# Patient Record
Sex: Male | Born: 1955 | Race: White | Hispanic: No | Marital: Single | Smoking: Former smoker
Health system: Southern US, Community
[De-identification: ages and names within clinical notes are randomized; demographics above are authoritative.]

## PROBLEM LIST (undated history)

## (undated) DIAGNOSIS — M1990 Unspecified osteoarthritis, unspecified site: Secondary | ICD-10-CM

## (undated) DIAGNOSIS — J189 Pneumonia, unspecified organism: Secondary | ICD-10-CM

## (undated) DIAGNOSIS — Z889 Allergy status to unspecified drugs, medicaments and biological substances status: Secondary | ICD-10-CM

## (undated) DIAGNOSIS — E7800 Pure hypercholesterolemia, unspecified: Secondary | ICD-10-CM

## (undated) DIAGNOSIS — I10 Essential (primary) hypertension: Secondary | ICD-10-CM

## (undated) DIAGNOSIS — J449 Chronic obstructive pulmonary disease, unspecified: Secondary | ICD-10-CM

## (undated) DIAGNOSIS — K219 Gastro-esophageal reflux disease without esophagitis: Secondary | ICD-10-CM

## (undated) DIAGNOSIS — R7303 Prediabetes: Secondary | ICD-10-CM

## (undated) DIAGNOSIS — Z87442 Personal history of urinary calculi: Secondary | ICD-10-CM

## (undated) HISTORY — PX: COLONOSCOPY: SHX174

## (undated) HISTORY — PX: UPPER GI ENDOSCOPY: SHX6162

---

## 2010-07-18 ENCOUNTER — Inpatient Hospital Stay (HOSPITAL_COMMUNITY): Admission: EM | Admit: 2010-07-18 | Discharge: 2010-07-20 | Payer: Self-pay | Admitting: Emergency Medicine

## 2010-08-17 ENCOUNTER — Emergency Department (HOSPITAL_COMMUNITY): Admission: EM | Admit: 2010-08-17 | Discharge: 2010-08-17 | Payer: Self-pay | Admitting: Emergency Medicine

## 2011-03-13 LAB — POCT I-STAT, CHEM 8
BUN: 14 mg/dL (ref 6–23)
Calcium, Ion: 1.15 mmol/L (ref 1.12–1.32)
Chloride: 104 mEq/L (ref 96–112)
Creatinine, Ser: 1.2 mg/dL (ref 0.4–1.5)
Glucose, Bld: 112 mg/dL — ABNORMAL HIGH (ref 70–99)
HCT: 48 % (ref 39.0–52.0)
Potassium: 4.1 mEq/L (ref 3.5–5.1)

## 2011-03-13 LAB — URINALYSIS, ROUTINE W REFLEX MICROSCOPIC
Bilirubin Urine: NEGATIVE
Glucose, UA: NEGATIVE mg/dL
Hgb urine dipstick: NEGATIVE
Nitrite: NEGATIVE
Specific Gravity, Urine: 1.019 (ref 1.005–1.030)

## 2011-03-13 LAB — RAPID URINE DRUG SCREEN, HOSP PERFORMED
Barbiturates: NOT DETECTED
Benzodiazepines: POSITIVE — AB
Opiates: NOT DETECTED

## 2011-03-14 LAB — BASIC METABOLIC PANEL
BUN: 12 mg/dL (ref 6–23)
BUN: 17 mg/dL (ref 6–23)
CO2: 28 mEq/L (ref 19–32)
CO2: 29 mEq/L (ref 19–32)
Calcium: 9.2 mg/dL (ref 8.4–10.5)
Calcium: 9.6 mg/dL (ref 8.4–10.5)
Creatinine, Ser: 1.13 mg/dL (ref 0.4–1.5)
GFR calc non Af Amer: >60 mL/min (ref >60)
Glucose, Bld: 105 mg/dL — ABNORMAL HIGH (ref 70–99)
Potassium: 4.1 mEq/L (ref 3.5–5.1)
Potassium: 4.5 mEq/L (ref 3.5–5.1)

## 2011-03-14 LAB — CARDIAC PANEL(CRET KIN+CKTOT+MB+TROPI)
Relative Index: INVALID (ref 0.0–2.5)
Total CK: 64 U/L (ref 7–232)
Troponin I: 0.02 ng/mL (ref 0.00–0.06)

## 2011-03-14 LAB — DIFFERENTIAL
Basophils Relative: 0 % (ref 0–1)
Eosinophils Relative: 0 % (ref 0–5)
Lymphocytes Relative: 16 % (ref 12–46)
Lymphs Abs: 1.2 10*3/uL (ref 0.7–4.0)
Monocytes Absolute: 0.7 10*3/uL (ref 0.1–1.0)

## 2011-03-14 LAB — TROPONIN I: Troponin I: 0.01 ng/mL (ref 0.00–0.06)

## 2011-03-14 LAB — POCT CARDIAC MARKERS
CKMB, poc: <1 ng/mL — ABNORMAL LOW (ref 1.0–8.0)
Myoglobin, poc: 76.7 ng/mL (ref 12–200)
Troponin i, poc: <0.05 ng/mL (ref 0.00–0.09)

## 2011-03-14 LAB — CBC
HCT: 44.6 % (ref 39.0–52.0)
Hemoglobin: 14.5 g/dL (ref 13.0–17.0)
Hemoglobin: 15.5 g/dL (ref 13.0–17.0)
MCH: 32.3 pg (ref 26.0–34.0)
MCHC: 34.1 g/dL (ref 30.0–36.0)
MCV: 92.8 fL (ref 78.0–100.0)
MCV: 94.3 fL (ref 78.0–100.0)
RBC: 4.8 MIL/uL (ref 4.22–5.81)

## 2011-03-14 LAB — GLUCOSE, CAPILLARY
Glucose-Capillary: 107 mg/dL — ABNORMAL HIGH (ref 70–99)
Glucose-Capillary: 112 mg/dL — ABNORMAL HIGH (ref 70–99)
Glucose-Capillary: 123 mg/dL — ABNORMAL HIGH (ref 70–99)

## 2011-03-14 LAB — LIPID PANEL
Cholesterol: 233 mg/dL — ABNORMAL HIGH (ref 0–200)
Total CHOL/HDL Ratio: 5.1 RATIO
Triglycerides: 183 mg/dL — ABNORMAL HIGH (ref <150)
VLDL: 37 mg/dL (ref 0–40)

## 2011-03-14 LAB — HEMOGLOBIN A1C: Mean Plasma Glucose: 117 mg/dL — ABNORMAL HIGH (ref <117)

## 2011-03-14 LAB — CK TOTAL AND CKMB (NOT AT ARMC)
CK, MB: 1 ng/mL (ref 0.3–4.0)
Total CK: 56 U/L (ref 7–232)

## 2011-07-30 IMAGING — CR DG CHEST 1V PORT
1 series · 1 of 1 positions shown · non-contrast
Comparison: None.

CLINICAL DATA: Chest pain and short of breath.

PORTABLE CHEST - 1 VIEW

[view not recorded]
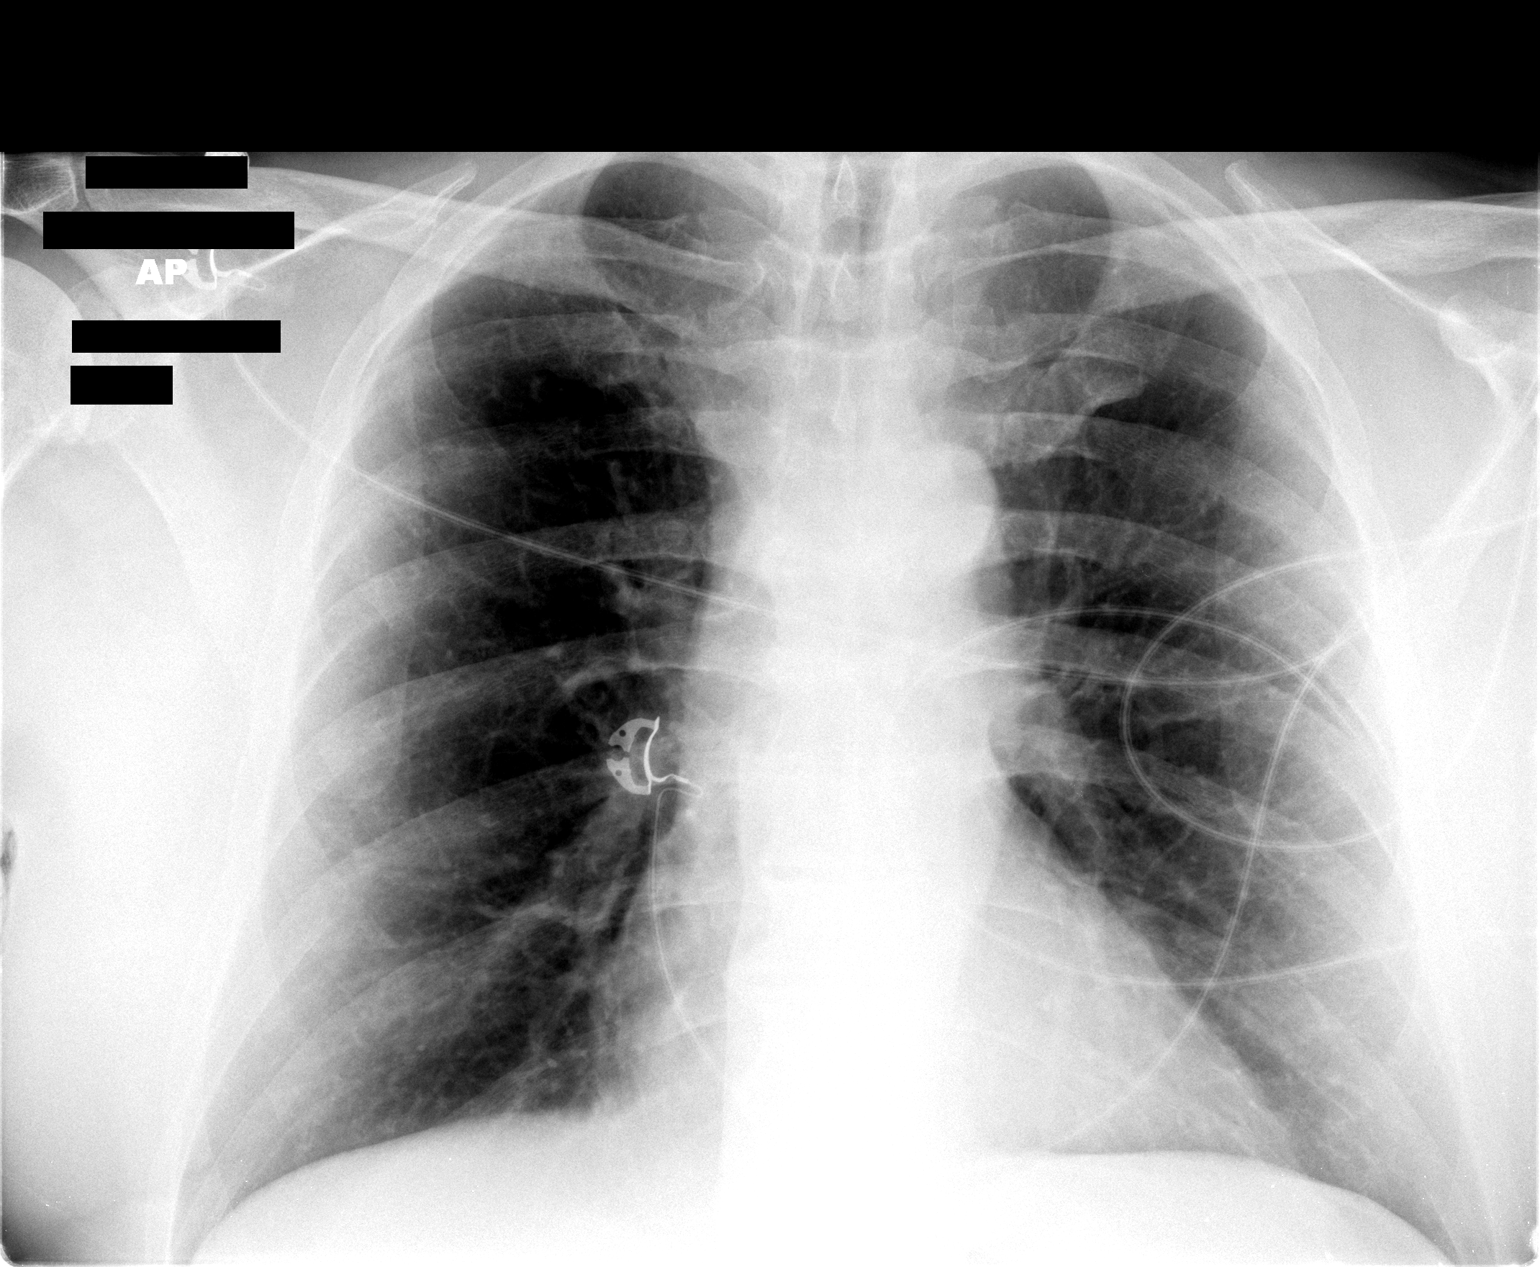

[1 of 1 positions shown; findings below may reference images not displayed]

FINDINGS: 5787 hours.  The lungs are clear without focal
infiltrate, edema, pneumothorax or pleural effusion.
Cardiopericardial silhouette is at upper limits of normal for size.
Telemetry leads overlie the chest.
IMPRESSION: No acute findings.

## 2012-03-08 ENCOUNTER — Ambulatory Visit (INDEPENDENT_AMBULATORY_CARE_PROVIDER_SITE_OTHER): Payer: No Typology Code available for payment source | Admitting: General Surgery

## 2012-03-12 ENCOUNTER — Emergency Department (HOSPITAL_COMMUNITY): Payer: No Typology Code available for payment source

## 2012-03-12 ENCOUNTER — Other Ambulatory Visit: Payer: Self-pay

## 2012-03-12 ENCOUNTER — Encounter (HOSPITAL_COMMUNITY): Payer: Self-pay

## 2012-03-12 ENCOUNTER — Emergency Department (HOSPITAL_COMMUNITY)
Admission: EM | Admit: 2012-03-12 | Discharge: 2012-03-12 | Disposition: A | Discharge status: Home / Self Care | Payer: No Typology Code available for payment source | Attending: Emergency Medicine | Admitting: Emergency Medicine

## 2012-03-12 DIAGNOSIS — R0602 Shortness of breath: Secondary | ICD-10-CM | POA: Insufficient documentation

## 2012-03-12 DIAGNOSIS — J189 Pneumonia, unspecified organism: Secondary | ICD-10-CM | POA: Insufficient documentation

## 2012-03-12 DIAGNOSIS — Z79899 Other long term (current) drug therapy: Secondary | ICD-10-CM | POA: Insufficient documentation

## 2012-03-12 DIAGNOSIS — E119 Type 2 diabetes mellitus without complications: Secondary | ICD-10-CM | POA: Insufficient documentation

## 2012-03-12 DIAGNOSIS — I10 Essential (primary) hypertension: Secondary | ICD-10-CM | POA: Insufficient documentation

## 2012-03-12 HISTORY — DX: Essential (primary) hypertension: I10

## 2012-03-12 LAB — BASIC METABOLIC PANEL
BUN: 15 mg/dL (ref 6–23)
CO2: 26 mEq/L (ref 19–32)
Calcium: 9.8 mg/dL (ref 8.4–10.5)
GFR calc non Af Amer: >90 mL/min (ref >90)
Glucose, Bld: 108 mg/dL — ABNORMAL HIGH (ref 70–99)

## 2012-03-12 LAB — CBC
HCT: 46.4 % (ref 39.0–52.0)
Hemoglobin: 17 g/dL (ref 13.0–17.0)
MCH: 32 pg (ref 26.0–34.0)
MCV: 87.2 fL (ref 78.0–100.0)
RBC: 5.32 MIL/uL (ref 4.22–5.81)
WBC: 6 10*3/uL (ref 4.0–10.5)

## 2012-03-12 LAB — DIFFERENTIAL
Eosinophils Absolute: 0 10*3/uL (ref 0.0–0.7)
Eosinophils Relative: 1 % (ref 0–5)
Lymphocytes Relative: 27 % (ref 12–46)
Lymphs Abs: 1.6 10*3/uL (ref 0.7–4.0)
Monocytes Relative: 18 % — ABNORMAL HIGH (ref 3–12)

## 2012-03-12 MED ORDER — MOXIFLOXACIN HCL IN NACL 400 MG/250ML IV SOLN
400.0000 mg | Freq: Once | INTRAVENOUS | Status: AC
  Administered 2012-03-12: 400 mg via INTRAVENOUS
  Filled 2012-03-12: qty 250

## 2012-03-12 MED ORDER — ALBUTEROL SULFATE HFA 108 (90 BASE) MCG/ACT IN AERS: 1.0000 | INHALATION_SPRAY | Freq: Four times a day (QID) | RESPIRATORY_TRACT | Status: AC | PRN

## 2012-03-12 MED ORDER — MOXIFLOXACIN HCL 400 MG PO TABS: 400.0000 mg | ORAL_TABLET | Freq: Every day | ORAL | Status: AC

## 2012-03-12 MED ORDER — ALBUTEROL SULFATE (5 MG/ML) 0.5% IN NEBU
5.0000 mg | INHALATION_SOLUTION | Freq: Once | RESPIRATORY_TRACT | Status: AC
  Administered 2012-03-12: 5 mg via RESPIRATORY_TRACT
  Filled 2012-03-12: qty 1

## 2012-03-12 MED ORDER — SODIUM CHLORIDE 0.9 % IV BOLUS (SEPSIS)
1000.0000 mL | Freq: Once | INTRAVENOUS | Status: AC
  Administered 2012-03-12: 1000 mL via INTRAVENOUS

## 2012-03-12 NOTE — ED Provider Notes (Signed)
History     CSN: [id number]  Arrival date & time 03/12/12  6213   First MD Initiated Contact with Patient 03/12/12 1005      Chief Complaint  Patient presents with  . Shortness of Breath    (Consider location/radiation/quality/duration/timing/severity/associated sxs/prior treatment) HPI  Patient presents to ER complaining of a 1-2 week hx of gradual onset sinus congestion, cough, SOB and chest congestion that he states has been persistent and worsening over time. Patient states he went to see PCP Dr. Donovan Kail last week and given cough medication but states that symptoms have been ongoing and worsening despite cough medication. Patient states he generally has felt fatigued and has called out of week all last week because "no energy to go in." Patient states he has felt hot and cold but has no known recorded temperature. Denies HA, sore throat, CP, abdominal pain, n/v/d, lower extremity pain or swelling. Patient has remote hx of tobacco abuse stating he quit smoking almost 9 years ago.   Past Medical History  Diagnosis Date  . Hypertension   . Diabetes mellitus     History reviewed. No pertinent past surgical history.  History reviewed. No pertinent family history.  History  Substance Use Topics  . Smoking status: Never Smoker   . Smokeless tobacco: Not on file  . Alcohol Use: Yes     history of.      Review of Systems  All other systems reviewed and are negative.    Allergies  Xanax  Home Medications   Current Outpatient Rx  Name Route Sig Dispense Refill  . VITAMIN C 1000 MG PO TABS Oral Take 2,000 mg by mouth daily.    . ASPIRIN EC 325 MG PO TBEC Oral Take 325 mg by mouth daily as needed. Pain    . BENZONATATE 200 MG PO CAPS Oral Take 200 mg by mouth 3 (three) times daily as needed. For cough    . CRANBERRY EXTRACT PO Oral Take 1 tablet by mouth daily.    . CYCLOBENZAPRINE HCL 10 MG PO TABS Oral Take 10 mg by mouth 3 (three) times daily.    Marland Kitchen  HYDROCODONE-HOMATROPINE 5-1.5 MG/5ML PO SYRP Oral Take 5 mLs by mouth every 6 (six) hours as needed. For cough    . LISINOPRIL-HYDROCHLOROTHIAZIDE 20-25 MG PO TABS Oral Take 1 tablet by mouth daily.    . CENTRUM SILVER PO Oral Take 1 tablet by mouth daily.    Marland Kitchen NAPROXEN 375 MG PO TABS Oral Take 375 mg by mouth 2 (two) times daily with a meal.    . SERTRALINE HCL 50 MG PO TABS Oral Take 25 mg by mouth daily.      BP 150/91  Pulse 120  Temp(Src) 98 F (36.7 C) (Oral)  Resp 24  SpO2 98%  Physical Exam  Nursing note and vitals reviewed. Constitutional: He is oriented to person, place, and time. He appears well-developed and well-nourished. No distress.  HENT:  Head: Normocephalic and atraumatic.  Eyes: Conjunctivae are normal.  Neck: Normal range of motion. Neck supple.  Cardiovascular: Regular rhythm, normal heart sounds and intact distal pulses.  Tachycardia present.  Exam reveals no gallop and no friction rub.   No murmur heard. Pulmonary/Chest: Effort normal. No respiratory distress. He has no wheezes. He has rhonchi in the right upper field, the right middle field, the right lower field, the left upper field, the left middle field and the left lower field. He has rales in the right lower  field and the left lower field. He exhibits no tenderness.  Abdominal: Bowel sounds are normal. He exhibits no distension and no mass. There is no tenderness. There is no rebound and no guarding.  Musculoskeletal: Normal range of motion. He exhibits no edema and no tenderness.  Neurological: He is alert and oriented to person, place, and time.  Skin: Skin is warm and dry. No rash noted. He is not diaphoretic. No erythema.  Psychiatric: He has a normal mood and affect.    ED Course  Procedures (including critical care time)  IV fluids, neb albuterol/atrovent  Labs Reviewed  CBC - Abnormal; Notable for the following:    MCHC 36.6 (*)    All other components within normal limits  DIFFERENTIAL -  Abnormal; Notable for the following:    Monocytes Relative 18 (*)    Monocytes Absolute 1.1 (*)    All other components within normal limits  BASIC METABOLIC PANEL - Abnormal; Notable for the following:    Sodium 132 (*)    Chloride 95 (*)    Glucose, Bld 108 (*)    All other components within normal limits   Dg Chest 2 View  03/12/2012  *RADIOLOGY REPORT*  Clinical Data: Cough, short of breath  CHEST - 2 VIEW  Comparison: Chest radiograph 07/18/2010  Findings: Normal cardiac silhouette.  The there is a left retrocardiac airspace opacity.  There is an 10 mm angular nodular density at the left lung base.  Right lung is clear. Degenerative osteophytosis of the thoracic spine.  IMPRESSION:  1.  Concern for left lower lobe pneumonia. 2.  Nodular density in the left lower lobe may relate to air space disease.  Recommend follow-up radiographs to ensure resolution and exclude pulmonary nodule.  Original Report Authenticated By: Genevive Bi, M.D.     1. CAP (community acquired pneumonia)      Date: 03/12/2012  Rate: 99  Rhythm: normal sinus rhythm  QRS Axis: normal  Intervals: normal  ST/T Wave abnormalities: normal  Conduction Disutrbances: none  Narrative Interpretation:   Old EKG Reviewed: No significant changes noted     MDM  Patient states he's feeling much improved with albuterol treatment an IV fluids. He has low comorbidities and therefore is appropriate for outpatient management of community-acquired pneumonia and he has an established primary care physician that he is agreeable to close followup next week for recheck. His heart rate is improved with IV fluids. He denies any associated chest pain. States his shortness of breath is dramatically improved after treatment. Patient is agreeable to returning to ER for any changing or worsening symptoms. He is afebrile and nontoxic-appearing. Pulse ox is 98% on room air. No respiratory distress the        [place],  [place] 03/12/12 1141  Lenon Oms [place], [place] 03/12/12 1219

## 2012-03-12 NOTE — Discharge Instructions (Signed)
Take antibiotic in its complete course. Use albuterol inhaler as needed for cough and shortness of breath. Stay well-hydrated. Followup with Dr. Tenny Craw next week for recheck but return to ER for changing or worsening of symptoms.  Pneumonia, Adult Pneumonia is an infection of the lungs.  CAUSES Pneumonia may be caused by bacteria or a virus. Usually, these infections are caused by breathing infectious particles into the lungs (respiratory tract). SYMPTOMS   Cough.   Fever.   Chest pain.   Increased rate of breathing.   Wheezing.   Mucus production.  DIAGNOSIS  If you have the common symptoms of pneumonia, your caregiver will typically confirm the diagnosis with a chest X-ray. The X-ray will show an abnormality in the lung (pulmonary infiltrate) if you have pneumonia. Other tests of your blood, urine, or sputum may be done to find the specific cause of your pneumonia. Your caregiver may also do tests (blood gases or pulse oximetry) to see how well your lungs are working. TREATMENT  Some forms of pneumonia may be spread to other people when you cough or sneeze. You may be asked to wear a mask before and during your exam. Pneumonia that is caused by bacteria is treated with antibiotic medicine. Pneumonia that is caused by the influenza virus may be treated with an antiviral medicine. Most other viral infections must run their course. These infections will not respond to antibiotics.  PREVENTION A pneumococcal shot (vaccine) is available to prevent a common bacterial cause of pneumonia. This is usually suggested for:  People over 49 years old.   Patients on chemotherapy.   People with chronic lung problems, such as bronchitis or emphysema.   People with immune system problems.  If you are over 65 or have a high risk condition, you may receive the pneumococcal vaccine if you have not received it before. In some countries, a routine influenza vaccine is also recommended. This vaccine can  help prevent some cases of pneumonia.You may be offered the influenza vaccine as part of your care. If you smoke, it is time to quit. You may receive instructions on how to stop smoking. Your caregiver can provide medicines and counseling to help you quit. HOME CARE INSTRUCTIONS   Cough suppressants may be used if you are losing too much rest. However, coughing protects you by clearing your lungs. You should avoid using cough suppressants if you can.   Your caregiver may have prescribed medicine if he or she thinks your pneumonia is caused by a bacteria or influenza. Finish your medicine even if you start to feel better.   Your caregiver may also prescribe an expectorant. This loosens the mucus to be coughed up.   Only take over-the-counter or prescription medicines for pain, discomfort, or fever as directed by your caregiver.   Do not smoke. Smoking is a common cause of bronchitis and can contribute to pneumonia. If you are a smoker and continue to smoke, your cough may last several weeks after your pneumonia has cleared.   A cold steam vaporizer or humidifier in your room or home may help loosen mucus.   Coughing is often worse at night. Sleeping in a semi-upright position in a recliner or using a couple pillows under your head will help with this.   Get rest as you feel it is needed. Your body will usually let you know when you need to rest.  SEEK IMMEDIATE MEDICAL CARE IF:   Your illness becomes worse. This is especially true if you  are elderly or weakened from any other disease.   You cannot control your cough with suppressants and are losing sleep.   You begin coughing up blood.   You develop pain which is getting worse or is uncontrolled with medicines.   You have a fever.   Any of the symptoms which initially brought you in for treatment are getting worse rather than better.   You develop shortness of breath or chest pain.  MAKE SURE YOU:   Understand these instructions.     Will watch your condition.   Will get help right away if you are not doing well or get worse.  Document Released: 12/14/2005 Document Revised: 12/03/2011 Document Reviewed: 03/05/2011 [facility] Patient Information 2012 [place], [place].

## 2012-03-12 NOTE — ED Notes (Signed)
Paged rt for neb

## 2012-03-12 NOTE — ED Notes (Signed)
Coughing and sob, onset last week, sts seen md for same and given medicine for cough, sts he now feels like it may be pna.

## 2012-03-13 NOTE — ED Provider Notes (Signed)
Medical screening examination/treatment/procedure(s) were performed by non-physician practitioner and as supervising physician I was immediately available for consultation/collaboration.  Noor Witte R. Dacey Milberger, MD 03/13/12 0655 

## 2012-04-05 ENCOUNTER — Ambulatory Visit (INDEPENDENT_AMBULATORY_CARE_PROVIDER_SITE_OTHER): Payer: No Typology Code available for payment source | Admitting: General Surgery

## 2015-05-15 ENCOUNTER — Emergency Department (HOSPITAL_COMMUNITY): Payer: No Typology Code available for payment source

## 2015-05-15 ENCOUNTER — Emergency Department (HOSPITAL_COMMUNITY)
Admission: EM | Admit: 2015-05-15 | Discharge: 2015-05-15 | Disposition: A | Discharge status: Home / Self Care | Payer: No Typology Code available for payment source | Attending: Emergency Medicine | Admitting: Emergency Medicine

## 2015-05-15 ENCOUNTER — Encounter (HOSPITAL_COMMUNITY): Payer: Self-pay | Admitting: Family Medicine

## 2015-05-15 DIAGNOSIS — R0981 Nasal congestion: Secondary | ICD-10-CM

## 2015-05-15 DIAGNOSIS — J069 Acute upper respiratory infection, unspecified: Secondary | ICD-10-CM | POA: Insufficient documentation

## 2015-05-15 DIAGNOSIS — I10 Essential (primary) hypertension: Secondary | ICD-10-CM | POA: Insufficient documentation

## 2015-05-15 DIAGNOSIS — E119 Type 2 diabetes mellitus without complications: Secondary | ICD-10-CM | POA: Insufficient documentation

## 2015-05-15 DIAGNOSIS — Z79899 Other long term (current) drug therapy: Secondary | ICD-10-CM | POA: Insufficient documentation

## 2015-05-15 DIAGNOSIS — R059 Cough, unspecified: Secondary | ICD-10-CM

## 2015-05-15 DIAGNOSIS — Z7982 Long term (current) use of aspirin: Secondary | ICD-10-CM | POA: Insufficient documentation

## 2015-05-15 DIAGNOSIS — Z791 Long term (current) use of non-steroidal anti-inflammatories (NSAID): Secondary | ICD-10-CM | POA: Insufficient documentation

## 2015-05-15 DIAGNOSIS — R05 Cough: Secondary | ICD-10-CM

## 2015-05-15 MED ORDER — HYDROCODONE-ACETAMINOPHEN 5-325 MG PO TABS: 1.0000 | ORAL_TABLET | Freq: Four times a day (QID) | ORAL | Status: DC | PRN

## 2015-05-15 MED ORDER — AMOXICILLIN-POT CLAVULANATE 875-125 MG PO TABS: 1.0000 | ORAL_TABLET | Freq: Two times a day (BID) | ORAL | Status: DC

## 2015-05-15 MED ORDER — ALBUTEROL SULFATE HFA 108 (90 BASE) MCG/ACT IN AERS
2.0000 | INHALATION_SPRAY | RESPIRATORY_TRACT | Status: DC | PRN
  Administered 2015-05-15: 2 via RESPIRATORY_TRACT
  Filled 2015-05-15: qty 6.7

## 2015-05-15 MED ORDER — GUAIFENESIN 100 MG/5ML PO LIQD: 100.0000 mg | ORAL | Status: DC | PRN

## 2015-05-15 MED ORDER — PREDNISONE 20 MG PO TABS: 40.0000 mg | ORAL_TABLET | Freq: Every day | ORAL | Status: DC

## 2015-05-15 NOTE — ED Notes (Signed)
Pt states had bronchitis 3 years ago turned into pneumonia-- was treated with steriods and antibiotics for 10 days.

## 2015-05-15 NOTE — ED Notes (Signed)
Pt here for cough and sts bronchitis. sts x 5 days. sts sinus pressure and HA.

## 2015-05-15 NOTE — ED Provider Notes (Signed)
CSN: [id number]     Arrival date & time 05/15/15  0719 History   First MD Initiated Contact with Patient 05/15/15 [phone]     Chief Complaint  Patient presents with  . Cough     (Consider location/radiation/quality/duration/timing/severity/associated sxs/prior Treatment) HPI Comments: Patient presents to the ED with a chief complaint of cough and sinus congestion/headache for the past 5-6 days.  States that he has tried OTC meds with no relief.  States that he is getting ready to start a new job and needs to be feeling better.  Denies fevers, chills, nausea, vomiting, diarrhea.  States that he has some chest pain when coughing only.  There are no aggravating or alleviating factors.  The history is provided by the patient. No language interpreter was used.    Past Medical History  Diagnosis Date  . Hypertension   . Diabetes mellitus    History reviewed. No pertinent past surgical history. History reviewed. No pertinent family history. History  Substance Use Topics  . Smoking status: Never Smoker   . Smokeless tobacco: Not on file  . Alcohol Use: Yes     Comment: history of.    Review of Systems  Constitutional: Negative for fever and chills.  HENT: Positive for postnasal drip, rhinorrhea, sinus pressure, sneezing and sore throat.   Respiratory: Positive for cough. Negative for shortness of breath.   Cardiovascular: Negative for chest pain.  Gastrointestinal: Negative for nausea, vomiting, abdominal pain, diarrhea and constipation.  Genitourinary: Negative for dysuria.      Allergies  Avelox and Alprazolam  Home Medications   Prior to Admission medications   Medication Sig Start Date End Date Taking? Authorizing Provider  Ascorbic Acid (VITAMIN C) 1000 MG tablet Take 2,000 mg by mouth daily.   Yes Historical Provider, MD  aspirin EC 325 MG tablet Take 325 mg by mouth daily as needed. Pain   Yes Historical Provider, MD  benzonatate (TESSALON) 200 MG capsule Take 200 mg by  mouth 3 (three) times daily as needed. For cough   Yes Historical Provider, MD  CRANBERRY EXTRACT PO Take 1 tablet by mouth daily.   Yes Historical Provider, MD  cyclobenzaprine (FLEXERIL) 10 MG tablet Take 10 mg by mouth 3 (three) times daily.   Yes Historical Provider, MD  lisinopril-hydrochlorothiazide (PRINZIDE,ZESTORETIC) 20-25 MG per tablet Take 1 tablet by mouth daily.   Yes Historical Provider, MD  Multiple Vitamins-Minerals (CENTRUM SILVER PO) Take 1 tablet by mouth daily.   Yes Historical Provider, MD  naproxen (NAPROSYN) 375 MG tablet Take 375 mg by mouth 2 (two) times daily with a meal.   Yes Historical Provider, MD  sertraline (ZOLOFT) 50 MG tablet Take 25 mg by mouth daily.   Yes Historical Provider, MD  HYDROcodone-homatropine (HYCODAN) 5-1.5 MG/5ML syrup Take 5 mLs by mouth every 6 (six) hours as needed. For cough    Historical Provider, MD   BP 153/93 mmHg  Pulse 95  Temp(Src) 98.1 F (36.7 C) (Oral)  Resp 17  SpO2 93% Physical Exam  Constitutional: He appears well-developed and well-nourished. No distress.  HENT:  Head: Normocephalic.  Right Ear: External ear normal.  Left Ear: External ear normal.  Mildly erythematous, no tonsillar exudate, no abscess, no stridor, uvula is midline  TMs clear bilaterally  Eyes: Conjunctivae and EOM are normal. Pupils are equal, round, and reactive to light.  Neck: Normal range of motion. Neck supple.  Cardiovascular: Normal rate, regular rhythm and normal heart sounds.  Exam reveals no gallop  and no friction rub.   No murmur heard. Pulmonary/Chest: Effort normal and breath sounds normal. No stridor. No respiratory distress. He has no wheezes. He has no rales. He exhibits no tenderness.  CTAB  Abdominal: Soft. Bowel sounds are normal. He exhibits no distension. There is no tenderness.  Musculoskeletal: Normal range of motion. He exhibits no tenderness.  Neurological: He is alert.  Skin: Skin is warm and dry. No rash noted. He is not  diaphoretic.  Psychiatric: He has a normal mood and affect. His behavior is normal. Judgment and thought content normal.  Nursing note and vitals reviewed.   ED Course  Procedures (including critical care time) Labs Review Labs Reviewed - No data to display  Imaging Review Dg Chest 2 View  05/15/2015   CLINICAL DATA:  Cough and shortness of breath for 1 week.  EXAM: CHEST  2 VIEW  COMPARISON:  03/12/2012.  FINDINGS: Trachea is midline. Heart size normal. There is mild prominence of the interstitial markings in the lung bases. No airspace consolidation or pleural fluid. Flowing anterior osteophytosis is seen in the thoracic spine.  IMPRESSION: Mild prominence of the interstitial markings at the lung bases may be technical in etiology. Difficult to exclude a viral or atypical infectious process.   Electronically Signed   By: Lorin Picket M.D.   On: 05/15/2015 07:59     EKG Interpretation None      MDM   Final diagnoses:  Sinus congestion  Acute URI   Patient with cough and sinus congestion.  Will treat with augmentin, steroids, MDI, and cough medicine.  Recommend PCP follow-up.  Return for new or worsening symptoms.  Vitals signs are stable.  No wheezing on exam.      Montine Circle, PA-C 05/15/15 2878  Serita Grit, MD 05/15/15 [phone]

## 2015-05-15 NOTE — Discharge Instructions (Signed)
Upper Respiratory Infection, Adult An upper respiratory infection (URI) is also sometimes known as the common cold. The upper respiratory tract includes the nose, sinuses, throat, trachea, and bronchi. Bronchi are the airways leading to the lungs. Most people improve within 1 week, but symptoms can last up to 2 weeks. A residual cough may last even longer.  CAUSES Many different viruses can infect the tissues lining the upper respiratory tract. The tissues become irritated and inflamed and often become very moist. Mucus production is also common. A cold is contagious. You can easily spread the virus to others by oral contact. This includes kissing, sharing a glass, coughing, or sneezing. Touching your mouth or nose and then touching a surface, which is then touched by another person, can also spread the virus. SYMPTOMS  Symptoms typically develop 1 to 3 days after you come in contact with a cold virus. Symptoms vary from person to person. They may include:  Runny nose.  Sneezing.  Nasal congestion.  Sinus irritation.  Sore throat.  Loss of voice (laryngitis).  Cough.  Fatigue.  Muscle aches.  Loss of appetite.  Headache.  Low-grade fever. DIAGNOSIS  You might diagnose your own cold based on familiar symptoms, since most people get a cold 2 to 3 times a year. Your caregiver can confirm this based on your exam. Most importantly, your caregiver can check that your symptoms are not due to another disease such as strep throat, sinusitis, pneumonia, asthma, or epiglottitis. Blood tests, throat tests, and X-rays are not necessary to diagnose a common cold, but they may sometimes be helpful in excluding other more serious diseases. Your caregiver will decide if any further tests are required. RISKS AND COMPLICATIONS  You may be at risk for a more severe case of the common cold if you smoke cigarettes, have chronic heart disease (such as heart failure) or lung disease (such as asthma), or if  you have a weakened immune system. The very young and very old are also at risk for more serious infections. Bacterial sinusitis, middle ear infections, and bacterial pneumonia can complicate the common cold. The common cold can worsen asthma and chronic obstructive pulmonary disease (COPD). Sometimes, these complications can require emergency medical care and may be life-threatening. PREVENTION  The best way to protect against getting a cold is to practice good hygiene. Avoid oral or hand contact with people with cold symptoms. Wash your hands often if contact occurs. There is no clear evidence that vitamin C, vitamin E, echinacea, or exercise reduces the chance of developing a cold. However, it is always recommended to get plenty of rest and practice good nutrition. TREATMENT  Treatment is directed at relieving symptoms. There is no cure. Antibiotics are not effective, because the infection is caused by a virus, not by bacteria. Treatment may include:  Increased fluid intake. Sports drinks offer valuable electrolytes, sugars, and fluids.  Breathing heated mist or steam (vaporizer or shower).  Eating chicken soup or other clear broths, and maintaining good nutrition.  Getting plenty of rest.  Using gargles or lozenges for comfort.  Controlling fevers with ibuprofen or acetaminophen as directed by your caregiver.  Increasing usage of your inhaler if you have asthma. Zinc gel and zinc lozenges, taken in the first 24 hours of the common cold, can shorten the duration and lessen the severity of symptoms. Pain medicines may help with fever, muscle aches, and throat pain. A variety of non-prescription medicines are available to treat congestion and runny nose. Your caregiver   can make recommendations and may suggest nasal or lung inhalers for other symptoms.  HOME CARE INSTRUCTIONS   Only take over-the-counter or prescription medicines for pain, discomfort, or fever as directed by your  caregiver.  Use a warm mist humidifier or inhale steam from a shower to increase air moisture. This may keep secretions moist and make it easier to breathe.  Drink enough water and fluids to keep your urine clear or pale yellow.  Rest as needed.  Return to work when your temperature has returned to normal or as your caregiver advises. You may need to stay home longer to avoid infecting others. You can also use a face mask and careful hand washing to prevent spread of the virus. SEEK MEDICAL CARE IF:   After the first few days, you feel you are getting worse rather than better.  You need your caregiver's advice about medicines to control symptoms.  You develop chills, worsening shortness of breath, or brown or red sputum. These may be signs of pneumonia.  You develop yellow or brown nasal discharge or pain in the face, especially when you bend forward. These may be signs of sinusitis.  You develop a fever, swollen neck glands, pain with swallowing, or white areas in the back of your throat. These may be signs of strep throat. SEEK IMMEDIATE MEDICAL CARE IF:   You have a fever.  You develop severe or persistent headache, ear pain, sinus pain, or chest pain.  You develop wheezing, a prolonged cough, cough up blood, or have a change in your usual mucus (if you have chronic lung disease).  You develop sore muscles or a stiff neck. Document Released: 06/09/2001 Document Revised: 03/07/2012 Document Reviewed: 03/21/2014 [employer] Patient Information 2015 [employer]. This information is not intended to replace advice given to you by your health care provider. Make sure you discuss any questions you have with your health care provider.  

## 2017-03-16 ENCOUNTER — Emergency Department (HOSPITAL_COMMUNITY)
Admission: EM | Admit: 2017-03-16 | Discharge: 2017-03-16 | Disposition: A | Discharge status: Home / Self Care | Payer: No Typology Code available for payment source | Attending: Emergency Medicine | Admitting: Emergency Medicine

## 2017-03-16 ENCOUNTER — Encounter (HOSPITAL_COMMUNITY): Payer: Self-pay

## 2017-03-16 DIAGNOSIS — J029 Acute pharyngitis, unspecified: Secondary | ICD-10-CM

## 2017-03-16 DIAGNOSIS — Z79899 Other long term (current) drug therapy: Secondary | ICD-10-CM | POA: Insufficient documentation

## 2017-03-16 DIAGNOSIS — I10 Essential (primary) hypertension: Secondary | ICD-10-CM | POA: Insufficient documentation

## 2017-03-16 DIAGNOSIS — Z7982 Long term (current) use of aspirin: Secondary | ICD-10-CM | POA: Insufficient documentation

## 2017-03-16 DIAGNOSIS — E119 Type 2 diabetes mellitus without complications: Secondary | ICD-10-CM | POA: Insufficient documentation

## 2017-03-16 LAB — RAPID STREP SCREEN (MED CTR MEBANE ONLY): STREPTOCOCCUS, GROUP A SCREEN (DIRECT): NEGATIVE

## 2017-03-16 MED ORDER — MAGIC MOUTHWASH: 5.0000 mL | Freq: Three times a day (TID) | ORAL | 0 refills | Status: DC | PRN

## 2017-03-16 NOTE — ED Triage Notes (Signed)
Patient complains of sore throat and pain with swallowing, NAD

## 2017-03-16 NOTE — ED Notes (Signed)
Pt c/o sore throat since last pm. States he also has had chest "tightness".

## 2017-03-16 NOTE — ED Provider Notes (Signed)
[facility] DEPT Provider Note   By signing my name below, I, Bea Graff, attest that this documentation has been prepared under the direction and in the presence of Kieana Livesay, PA-C. Electronically Signed: Bea Graff, ED Scribe. 03/16/17. 4:57 PM.    History   Chief Complaint No chief complaint on file.  HPI  Glenn Lane is an obese 61 y.o. male who presents to the Emergency Department complaining of a sore throat that began last night. He reports associated referred pain of the left ear, HA and chest tightness. Pt rates the pain at 9.5/10. He denies any known sick contacts. He has taken Aleve and done salt water gargles but neither has helped. Swallowing increases the throat pain. He denies alleviating factors. He denies fever, chills, sneezing, cough, nausea, vomiting, CP, difficulty swallowing or breathing.   Past Medical History:  Diagnosis Date  . Diabetes mellitus   . Hypertension     There are no active problems to display for this patient.   History reviewed. No pertinent surgical history.     Home Medications    Prior to Admission medications   Medication Sig Start Date End Date Taking? Authorizing Provider  amoxicillin-clavulanate (AUGMENTIN) 875-125 MG per tablet Take 1 tablet by mouth every 12 (twelve) hours. 05/15/15   Montine Circle, PA-C  Ascorbic Acid (VITAMIN C) 1000 MG tablet Take 2,000 mg by mouth daily.    Historical Provider, MD  aspirin EC 325 MG tablet Take 325 mg by mouth daily as needed. Pain    Historical Provider, MD  benzonatate (TESSALON) 200 MG capsule Take 200 mg by mouth 3 (three) times daily as needed. For cough    Historical Provider, MD  CRANBERRY EXTRACT PO Take 1 tablet by mouth daily.    Historical Provider, MD  cyclobenzaprine (FLEXERIL) 10 MG tablet Take 10 mg by mouth 3 (three) times daily.    Historical Provider, MD  guaiFENesin (ROBITUSSIN) 100 MG/5ML liquid Take 5-10 mLs (100-200 mg total) by mouth every 4 (four)  hours as needed for cough. 05/15/15   Montine Circle, PA-C  HYDROcodone-acetaminophen (NORCO/VICODIN) 5-325 MG per tablet Take 1-2 tablets by mouth every 6 (six) hours as needed. 05/15/15   Montine Circle, PA-C  HYDROcodone-homatropine [facility]) 5-1.5 MG/5ML syrup Take 5 mLs by mouth every 6 (six) hours as needed. For cough    Historical Provider, MD  lisinopril-hydrochlorothiazide (PRINZIDE,ZESTORETIC) 20-25 MG per tablet Take 1 tablet by mouth daily.    Historical Provider, MD  magic mouthwash SOLN Take 5 mLs by mouth 3 (three) times daily as needed for mouth pain. 03/16/17   Angeliki Mates, PA  Multiple Vitamins-Minerals (CENTRUM SILVER PO) Take 1 tablet by mouth daily.    Historical Provider, MD  naproxen (NAPROSYN) 375 MG tablet Take 375 mg by mouth 2 (two) times daily with a meal.    Historical Provider, MD  predniSONE (DELTASONE) 20 MG tablet Take 2 tablets (40 mg total) by mouth daily. 05/15/15   Montine Circle, PA-C  sertraline (ZOLOFT) 50 MG tablet Take 25 mg by mouth daily.    Historical Provider, MD    Family History No family history on file.  Social History Social History  Substance Use Topics  . Smoking status: Never Smoker  . Smokeless tobacco: Not on file  . Alcohol use Yes     Comment: history of.     Allergies   Avelox [moxifloxacin hcl in nacl] and Alprazolam   Review of Systems Review of Systems  Constitutional: Positive for appetite  change. Negative for chills and fever.  HENT: Positive for ear pain and sore throat. Negative for congestion, hearing loss, sneezing and trouble swallowing.   Eyes: Negative for photophobia and visual disturbance.  Respiratory: Positive for chest tightness. Negative for cough and shortness of breath.   Cardiovascular: Negative for chest pain.  Gastrointestinal: Negative for abdominal pain, constipation, diarrhea, nausea and vomiting.  Genitourinary: Negative for dysuria.  Musculoskeletal: Negative for myalgias.  Skin: Negative for  rash.  Neurological: Positive for headaches. Negative for light-headedness.  Hematological: Negative for adenopathy.     Physical Exam Updated Vital Signs BP (!) 168/112 (BP Location: Right Arm)   Pulse 93   Temp 98.7 F (37.1 C) (Oral)   Resp 20   SpO2 97%   Physical Exam  Constitutional: He is oriented to person, place, and time. He appears well-developed and well-nourished. No distress.  HENT:  Head: Normocephalic and atraumatic.  Right Ear: Tympanic membrane and ear canal normal.  Left Ear: Tympanic membrane and ear canal normal.  Mouth/Throat: Uvula is midline and mucous membranes are normal. Posterior oropharyngeal erythema present. No oropharyngeal exudate, posterior oropharyngeal edema or tonsillar abscesses.  Eyes: Conjunctivae and EOM are normal. No scleral icterus.  Neck: Normal range of motion.  Cardiovascular: Normal rate, regular rhythm and normal heart sounds.  Exam reveals no gallop and no friction rub.   No murmur heard. Pulmonary/Chest: Effort normal and breath sounds normal. No respiratory distress. He has no wheezes. He has no rales.  Abdominal: Soft. Bowel sounds are normal. There is no tenderness.  Musculoskeletal: Normal range of motion.  Lymphadenopathy:    He has no cervical adenopathy.  Neurological: He is alert and oriented to person, place, and time.  Skin: Skin is warm and dry.  Psychiatric: He has a normal mood and affect. His behavior is normal.  Nursing note and vitals reviewed.    ED Treatments / Results  DIAGNOSTIC STUDIES: Oxygen Saturation is 97% on RA, normal by my interpretation.   COORDINATION OF CARE: 4:35 PM- Will wait for strep test to result. Pt verbalizes understanding and agrees to plan.  Medications - No data to display  Labs (all labs ordered are listed, but only abnormal results are displayed) Labs Reviewed  RAPID STREP SCREEN (NOT AT [facility])  CULTURE, GROUP A STREP [facility])    EKG  EKG Interpretation None        Radiology No results found.  Procedures Procedures (including critical care time)  Medications Ordered in ED Medications - No data to display   Initial Impression / Assessment and Plan / ED Course  I have reviewed the triage vital signs and the nursing notes.  Pertinent labs & imaging results that were available during my care of the patient were reviewed by me and considered in my medical decision making (see chart for details).     Patient's illness appeared viral. Not concerned about influenza due to absence of fevers or body aches. Throat appears viral as well (no exudates or petechiae). Strep test was ordered and was negative. Patient will be given magic mouthwash for symptomatic relief and educated on continuing supportive measures including Tylenol, ibuprofen and salt water gargles. Patient voiced understanding. Return precautions were given.   I personally performed the services described in this documentation, which was scribed in my presence. The recorded information has been reviewed and is accurate.   Final Clinical Impressions(s) / ED Diagnoses   Final diagnoses:  Sore throat    New Prescriptions Discharge Medication  List as of 03/16/2017  5:13 PM    START taking these medications   Details  magic mouthwash SOLN Take 5 mLs by mouth 3 (three) times daily as needed for mouth pain., Starting Tue 03/16/2017, [place], [place] 03/17/17 0002    Merrily Pew, MD 03/18/17 [phone]

## 2017-03-16 NOTE — Discharge Instructions (Signed)
Begin using magic mouthwash three times daily for sore throat. Continue supportive care including Tylenol, ibuprofen, salt water gargles. Return to ED for symptoms such as trouble breathing, chest pain or fever.

## 2017-03-19 LAB — CULTURE, GROUP A STREP (THRC)

## 2017-03-21 ENCOUNTER — Emergency Department (HOSPITAL_COMMUNITY)
Admission: EM | Admit: 2017-03-21 | Discharge: 2017-03-21 | Disposition: A | Discharge status: Home / Self Care | Payer: No Typology Code available for payment source | Attending: Emergency Medicine | Admitting: Emergency Medicine

## 2017-03-21 ENCOUNTER — Emergency Department (HOSPITAL_COMMUNITY): Payer: No Typology Code available for payment source

## 2017-03-21 ENCOUNTER — Encounter (HOSPITAL_COMMUNITY): Payer: Self-pay | Admitting: Emergency Medicine

## 2017-03-21 DIAGNOSIS — J40 Bronchitis, not specified as acute or chronic: Secondary | ICD-10-CM | POA: Insufficient documentation

## 2017-03-21 DIAGNOSIS — J029 Acute pharyngitis, unspecified: Secondary | ICD-10-CM

## 2017-03-21 DIAGNOSIS — I10 Essential (primary) hypertension: Secondary | ICD-10-CM | POA: Insufficient documentation

## 2017-03-21 DIAGNOSIS — Z7982 Long term (current) use of aspirin: Secondary | ICD-10-CM | POA: Insufficient documentation

## 2017-03-21 DIAGNOSIS — E119 Type 2 diabetes mellitus without complications: Secondary | ICD-10-CM | POA: Insufficient documentation

## 2017-03-21 LAB — CBC WITH DIFFERENTIAL/PLATELET
Basophils Absolute: 0 10*3/uL (ref 0.0–0.1)
Basophils Relative: 0 %
EOS ABS: 0.3 10*3/uL (ref 0.0–0.7)
EOS PCT: 4 %
HCT: 50.3 % (ref 39.0–52.0)
Hemoglobin: 17.3 g/dL — ABNORMAL HIGH (ref 13.0–17.0)
LYMPHS ABS: 1.1 10*3/uL (ref 0.7–4.0)
Lymphocytes Relative: 12 %
MCH: 31.6 pg (ref 26.0–34.0)
MCHC: 34.4 g/dL (ref 30.0–36.0)
MCV: 91.8 fL (ref 78.0–100.0)
MONO ABS: 1 10*3/uL (ref 0.1–1.0)
MONOS PCT: 12 %
Neutro Abs: 6.3 10*3/uL (ref 1.7–7.7)
Neutrophils Relative %: 72 %
PLATELETS: 181 10*3/uL (ref 150–400)
RBC: 5.48 MIL/uL (ref 4.22–5.81)
RDW: 12.6 % (ref 11.5–15.5)
WBC: 8.7 10*3/uL (ref 4.0–10.5)

## 2017-03-21 LAB — BASIC METABOLIC PANEL
Anion gap: 11 (ref 5–15)
BUN: 11 mg/dL (ref 6–20)
CO2: 24 mmol/L (ref 22–32)
Calcium: 9.1 mg/dL (ref 8.9–10.3)
Chloride: 104 mmol/L (ref 101–111)
Creatinine, Ser: 0.95 mg/dL (ref 0.61–1.24)
GFR calc Af Amer: >60 mL/min (ref >60)
GFR calc non Af Amer: >60 mL/min (ref >60)
Glucose, Bld: 110 mg/dL — ABNORMAL HIGH (ref 65–99)
Potassium: 4.1 mmol/L (ref 3.5–5.1)
SODIUM: 139 mmol/L (ref 135–145)

## 2017-03-21 MED ORDER — AMOXICILLIN-POT CLAVULANATE 875-125 MG PO TABS: 1.0000 | ORAL_TABLET | Freq: Two times a day (BID) | ORAL | 0 refills | Status: DC

## 2017-03-21 MED ORDER — LISINOPRIL 10 MG PO TABS: 10.0000 mg | ORAL_TABLET | Freq: Every day | ORAL | 0 refills | Status: DC

## 2017-03-21 MED ORDER — ALBUTEROL SULFATE HFA 108 (90 BASE) MCG/ACT IN AERS
2.0000 | INHALATION_SPRAY | RESPIRATORY_TRACT | Status: DC | PRN
  Administered 2017-03-21: 2 via RESPIRATORY_TRACT
  Filled 2017-03-21: qty 6.7

## 2017-03-21 MED ORDER — PREDNISONE 20 MG PO TABS: 40.0000 mg | ORAL_TABLET | Freq: Every day | ORAL | 0 refills | Status: DC

## 2017-03-21 NOTE — ED Notes (Signed)
Patient transported to X-ray 

## 2017-03-21 NOTE — ED Notes (Signed)
Pt stets he understands instructions. Home stable with steady gait.

## 2017-03-21 NOTE — ED Notes (Signed)
Pt ambulated down hallway. Beginning SpO2 94. Maintained 93 throughout ambulation. Pt tolerated well.

## 2017-03-21 NOTE — Discharge Instructions (Signed)
Please read and follow all provided instructions.  Your diagnoses today include:  1. Sore throat   2. Bronchitis   3. Essential hypertension     Tests performed today include:  Chest x-ray - does not show any pneumonia  Blood counts and electrolytes  EKG  Vital signs. See below for your results today.   Medications prescribed:   Augmentin - antibiotic  You have been prescribed an antibiotic medicine: take the entire course of medicine even if you are feeling better. Stopping early can cause the antibiotic not to work.   Prednisone - steroid medicine   It is best to take this medication in the morning to prevent sleeping problems. If you are diabetic, monitor your blood sugar closely and stop taking Prednisone if blood sugar is over 300. Take with food to prevent stomach upset.    Albuterol inhaler - medication that opens up your airway  Use inhaler as follows: 1-2 puffs with spacer every 4 hours as needed for wheezing, cough, or shortness of breath.   Take any prescribed medications only as directed.  Home care instructions:  Follow any educational materials contained in this packet.  Follow-up instructions: Please follow-up with your primary care provider in the next 3 days for further evaluation of your symptoms and a recheck if you are not feeling better.   Return instructions:   Please return to the Emergency Department if you experience worsening symptoms.  Please return with worsening wheezing, shortness of breath, or difficulty breathing.  Return with persistent fever above 101F.   Please return if you have any other emergent concerns.  Additional Information:  Your vital signs today were: BP (!) 164/102 (BP Location: Right Arm)    Pulse 73    Temp 98.2 F (36.8 C) (Oral)    Resp 16    Ht 6\' 5"  (1.956 m)    Wt (!) 138.3 kg    SpO2 91%    BMI 36.17 kg/m  If your blood pressure (BP) was elevated above 135/85 this visit, please have this repeated by your  doctor within one month. --------------

## 2017-03-21 NOTE — ED Provider Notes (Signed)
[facility] DEPT Provider Note   CSN: [id number] Arrival date & time: 03/21/17  0706     History   Chief Complaint Chief Complaint  Patient presents with  . Sore Throat  . Cough  . Hypertension    HPI Glenn Lane is a 61 y.o. male.  Patient with history of "prediabetes", hypertension presents with complaint of sore throat, chest congestion, chest tightness, and cough starting approximately 6 days ago. Patient states that the symptoms are becoming worse. He feels like there is a "ball" in the left side of his throat. He is able to swallow but states that it is becoming more difficult. Patient was seen in emergency department on 03/16/17 and had a negative strep test. No fevers, runny nose. Patient has had a hoarse voice. He states that he feels "a little more" short of breath when he exerts himself. No current medications for blood pressure. He does not smoke. No history of asthma. He has been taking over-the-counter medications and Magic mouthwash without relief. Chest tightness is worse with coughing and is nonexertional. The onset of this condition was acute. 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       Past Medical History:  Diagnosis Date  . Diabetes mellitus   . Hypertension     There are no active problems to display for this patient.   History reviewed. No pertinent surgical history.     Home Medications    Prior to Admission medications   Medication Sig Start Date End Date Taking? Authorizing Provider  amoxicillin-clavulanate (AUGMENTIN) 875-125 MG per tablet Take 1 tablet by mouth every 12 (twelve) hours. 05/15/15   Montine Circle, PA-C  Ascorbic Acid (VITAMIN C) 1000 MG tablet Take 2,000 mg by mouth daily.    Historical Provider, MD  aspirin EC 325 MG tablet Take 325 mg by mouth daily as needed. Pain    Historical Provider, MD  benzonatate (TESSALON) 200 MG capsule  Take 200 mg by mouth 3 (three) times daily as needed. For cough    Historical Provider, MD  CRANBERRY EXTRACT PO Take 1 tablet by mouth daily.    Historical Provider, MD  cyclobenzaprine (FLEXERIL) 10 MG tablet Take 10 mg by mouth 3 (three) times daily.    Historical Provider, MD  guaiFENesin (ROBITUSSIN) 100 MG/5ML liquid Take 5-10 mLs (100-200 mg total) by mouth every 4 (four) hours as needed for cough. 05/15/15   Montine Circle, PA-C  HYDROcodone-acetaminophen (NORCO/VICODIN) 5-325 MG per tablet Take 1-2 tablets by mouth every 6 (six) hours as needed. 05/15/15   Montine Circle, PA-C  HYDROcodone-homatropine [facility]) 5-1.5 MG/5ML syrup Take 5 mLs by mouth every 6 (six) hours as needed. For cough    Historical Provider, MD  lisinopril-hydrochlorothiazide (PRINZIDE,ZESTORETIC) 20-25 MG per tablet Take 1 tablet by mouth daily.    Historical Provider, MD  magic mouthwash SOLN Take 5 mLs by mouth 3 (three) times daily as needed for mouth pain. 03/16/17   Hina Khatri, PA  Multiple Vitamins-Minerals (CENTRUM SILVER PO) Take 1 tablet by mouth daily.    Historical Provider, MD  naproxen (NAPROSYN) 375 MG tablet Take 375 mg by mouth 2 (two) times daily with a meal.    Historical Provider, MD  predniSONE (DELTASONE) 20 MG tablet Take 2 tablets (40 mg total) by mouth daily. 05/15/15   Montine Circle, PA-C  sertraline (ZOLOFT) 50 MG tablet Take 25 mg by mouth daily.  Historical Provider, MD    Family History No family history on file.  Social History Social History  Substance Use Topics  . Smoking status: Never Smoker  . Smokeless tobacco: Never Used  . Alcohol use Yes     Comment: history of.     Allergies   Avelox [moxifloxacin hcl in nacl] and Alprazolam   Review of Systems Review of Systems  Constitutional: Negative for chills, fatigue and fever.  HENT: Positive for sore throat and trouble swallowing. Negative for congestion, ear pain, rhinorrhea and sinus pressure.   Eyes: Negative for  redness.  Respiratory: Positive for cough and shortness of breath. Negative for wheezing.   Gastrointestinal: Negative for abdominal pain, diarrhea, nausea and vomiting.  Genitourinary: Negative for dysuria.  Musculoskeletal: Negative for myalgias and neck stiffness.  Skin: Negative for rash.  Neurological: Negative for headaches.  Hematological: Negative for adenopathy.     Physical Exam Updated Vital Signs BP (!) 161/111   Pulse 80   Temp 98.2 F (36.8 C) (Oral)   Resp 18   Ht 6\' 5"  (1.956 m)   Wt (!) 138.3 kg   SpO2 97%   BMI 36.17 kg/m   Physical Exam  Constitutional: He appears well-developed and well-nourished.  HENT:  Head: Normocephalic and atraumatic.  Right Ear: Tympanic membrane, external ear and ear canal normal.  Left Ear: Tympanic membrane, external ear and ear canal normal.  Nose: Nose normal. No mucosal edema or rhinorrhea.  Mouth/Throat: Uvula is midline and mucous membranes are normal. Mucous membranes are not dry. No trismus (full ROM jaw without pain, states he feels more pressure in L neck when he opens wide) in the jaw. No uvula swelling. Posterior oropharyngeal erythema (mild) present. No oropharyngeal exudate, posterior oropharyngeal edema or tonsillar abscesses.  No signs of Ludwig's angina.   Eyes: Conjunctivae are normal. Right eye exhibits no discharge. Left eye exhibits no discharge.  Neck: Normal range of motion. Neck supple.  Full ROM of neck without limitation. Swallows without problem.   Cardiovascular: Normal rate, regular rhythm and normal heart sounds.   Pulmonary/Chest: Effort normal and breath sounds normal. No respiratory distress. He has no wheezes. He has no rales.  Abdominal: Soft. There is no tenderness.  Lymphadenopathy:    He has no cervical adenopathy.  Neurological: He is alert.  Skin: Skin is warm and dry.  Psychiatric: He has a normal mood and affect.  Nursing note and vitals reviewed.    ED Treatments / Results   Labs (all labs ordered are listed, but only abnormal results are displayed) Labs Reviewed  CBC WITH DIFFERENTIAL/PLATELET - Abnormal; Notable for the following:       Result Value   Hemoglobin 17.3 (*)    All other components within normal limits  BASIC METABOLIC PANEL - Abnormal; Notable for the following:    Glucose, Bld 110 (*)    All other components within normal limits     ED ECG REPORT   Date: 03/21/2017  Rate: 83  Rhythm: normal sinus rhythm  QRS Axis: normal  Intervals: normal  ST/T Wave abnormalities: normal  Conduction Disutrbances: none  Narrative Interpretation:   Old EKG Reviewed: unchanged  I have personally reviewed the EKG tracing and agree with the computerized printout as noted.   Radiology Dg Chest 2 View  Result Date: 03/21/2017 CLINICAL DATA:  Patient with cough, sore throat and chest tightness. EXAM: CHEST  2 VIEW COMPARISON:  Chest radiograph 05/15/2015 FINDINGS: Stable cardiac and mediastinal contours. Tortuosity  thoracic aorta. No consolidative pulmonary opacities. No pleural effusion or pneumothorax. Thoracic spine degenerative changes. IMPRESSION: No active cardiopulmonary disease. Electronically Signed   By: Lovey Newcomer M.D.   On: 03/21/2017 07:51    Procedures Procedures (including critical care time)  Medications Ordered in ED Medications  albuterol (PROVENTIL HFA;VENTOLIN HFA) 108 (90 Base) MCG/ACT inhaler 2 puff (not administered)     Initial Impression / Assessment and Plan / ED Course  I have reviewed the triage vital signs and the nursing notes.  Pertinent labs & imaging results that were available during my care of the patient were reviewed by me and considered in my medical decision making (see chart for details).     Patient seen and examined. I don't see any concerning physical exam signs that would lead me to do CT neck here today. No trismus, good ROM of neck. As symptoms not improving, will check labs, EKG, CXR. Tightness  is atypical for angina, no radiation, exertional pain/pressure, diaphoresis. Discussed with Dr. Laverta Baltimore.   Vital signs reviewed and are as follows: BP (!) 161/111   Pulse 80   Temp 98.2 F (36.8 C) (Oral)   Resp 18   Ht 6\' 5"  (1.956 m)   Wt (!) 138.3 kg   SpO2 97%   BMI 36.17 kg/m   8:42 AM Chest imaging neg. Labs reassuring. O2 saturations have been borderline. Will ambulate and check.   9:52 AM Patient ambulated and maintained O2 sat in normal range.   Discussed results with patient. Will treat for possible bronchitis given 1 week of symptoms with augmentin/prednisone/albuterol --> has good results with this in past. Will need f/u with PCP if symptoms not improved in 1 week.   For BP: Will restart on 10mg  lisinopril daily. Will need PCP f/u for titration. Referral given.   If symptoms worsen, patient has difficulty with swallowing, chest pain, shortness of breath, discussed she should return to the emergency department.  Final Clinical Impressions(s) / ED Diagnoses   Final diagnoses:  Sore throat  Bronchitis  Essential hypertension   Sore throat, congestion, cough: Suggestive of bronchitis. Chest x-ray does not show pneumonia or edema. Patient has a slightly wheeze only with cough but has some chest tightness. EKG is reassuring. Will treat as above given duration of symptoms. No concern for deep space infection in neck given exam. No fevers, good range of motion, no trismus. Would not CT at this time. Symptoms do not improve, would benefit from PCP evaluation for GI etiology.  Hypertension: Patient noncompliant with medications. Will restart on lisinopril and have follow-up with PCP. Referral given. New Prescriptions New Prescriptions   AMOXICILLIN-CLAVULANATE (AUGMENTIN) 875-125 MG TABLET    Take 1 tablet by mouth every 12 (twelve) hours.   LISINOPRIL (PRINIVIL,ZESTRIL) 10 MG TABLET    Take 1 tablet (10 mg total) by mouth daily.   PREDNISONE (DELTASONE) 20 MG TABLET    Take 2  tablets (40 mg total) by mouth daily.     Carlisle Cater, PA-C 03/21/17 [place], PA-C 03/21/17 [place], MD 03/21/17 [phone]

## 2017-03-21 NOTE — ED Triage Notes (Addendum)
Pt reports continues to have sore throat and now with non productive cough. Pt has tried over the counter remedies and prescribed magic mouthwash with no relief. Pt has not taken his BP medicine in 1 year due to not having a PMD.

## 2017-04-08 ENCOUNTER — Encounter (HOSPITAL_COMMUNITY): Payer: Self-pay

## 2017-04-08 ENCOUNTER — Emergency Department (HOSPITAL_COMMUNITY)
Admission: EM | Admit: 2017-04-08 | Discharge: 2017-04-08 | Disposition: A | Discharge status: Home / Self Care | Payer: No Typology Code available for payment source | Attending: Emergency Medicine | Admitting: Emergency Medicine

## 2017-04-08 ENCOUNTER — Emergency Department (HOSPITAL_COMMUNITY): Payer: No Typology Code available for payment source

## 2017-04-08 DIAGNOSIS — Z79899 Other long term (current) drug therapy: Secondary | ICD-10-CM | POA: Insufficient documentation

## 2017-04-08 DIAGNOSIS — Z7982 Long term (current) use of aspirin: Secondary | ICD-10-CM | POA: Insufficient documentation

## 2017-04-08 DIAGNOSIS — J40 Bronchitis, not specified as acute or chronic: Secondary | ICD-10-CM | POA: Insufficient documentation

## 2017-04-08 DIAGNOSIS — E119 Type 2 diabetes mellitus without complications: Secondary | ICD-10-CM | POA: Insufficient documentation

## 2017-04-08 DIAGNOSIS — I10 Essential (primary) hypertension: Secondary | ICD-10-CM | POA: Insufficient documentation

## 2017-04-08 LAB — I-STAT TROPONIN, ED: Troponin i, poc: 0 ng/mL (ref 0.00–0.08)

## 2017-04-08 LAB — BASIC METABOLIC PANEL
Anion gap: 12 (ref 5–15)
BUN: 12 mg/dL (ref 6–20)
CHLORIDE: 103 mmol/L (ref 101–111)
CO2: 23 mmol/L (ref 22–32)
Calcium: 9.1 mg/dL (ref 8.9–10.3)
Creatinine, Ser: 1.17 mg/dL (ref 0.61–1.24)
GFR calc Af Amer: >60 mL/min (ref >60)
GFR calc non Af Amer: >60 mL/min (ref >60)
GLUCOSE: 105 mg/dL — AB (ref 65–99)
POTASSIUM: 3.8 mmol/L (ref 3.5–5.1)
SODIUM: 138 mmol/L (ref 135–145)

## 2017-04-08 LAB — CBC
HEMATOCRIT: 49.7 % (ref 39.0–52.0)
Hemoglobin: 16.8 g/dL (ref 13.0–17.0)
MCH: 31.1 pg (ref 26.0–34.0)
MCHC: 33.8 g/dL (ref 30.0–36.0)
MCV: 92 fL (ref 78.0–100.0)
PLATELETS: 208 10*3/uL (ref 150–400)
RBC: 5.4 MIL/uL (ref 4.22–5.81)
RDW: 12.9 % (ref 11.5–15.5)
WBC: 8.2 10*3/uL (ref 4.0–10.5)

## 2017-04-08 MED ORDER — AEROCHAMBER PLUS FLO-VU MEDIUM MISC
1.0000 | Freq: Once | Status: AC
  Administered 2017-04-08: 1
  Filled 2017-04-08 (×2): qty 1

## 2017-04-08 MED ORDER — PREDNISONE 20 MG PO TABS
60.0000 mg | ORAL_TABLET | Freq: Once | ORAL | Status: AC
  Administered 2017-04-08: 60 mg via ORAL
  Filled 2017-04-08: qty 3

## 2017-04-08 MED ORDER — DOXYCYCLINE HYCLATE 100 MG PO CAPS: 100.0000 mg | ORAL_CAPSULE | Freq: Two times a day (BID) | ORAL | 0 refills | Status: DC

## 2017-04-08 MED ORDER — BENZONATATE 100 MG PO CAPS: 100.0000 mg | ORAL_CAPSULE | Freq: Three times a day (TID) | ORAL | 0 refills | Status: DC

## 2017-04-08 MED ORDER — ALBUTEROL SULFATE (2.5 MG/3ML) 0.083% IN NEBU: 5.0000 mg | INHALATION_SOLUTION | Freq: Once | RESPIRATORY_TRACT | Status: DC

## 2017-04-08 MED ORDER — IPRATROPIUM-ALBUTEROL 0.5-2.5 (3) MG/3ML IN SOLN
3.0000 mL | Freq: Once | RESPIRATORY_TRACT | Status: AC
  Administered 2017-04-08: 3 mL via RESPIRATORY_TRACT
  Filled 2017-04-08: qty 3

## 2017-04-08 MED ORDER — PREDNISONE 10 MG (21) PO TBPK: ORAL_TABLET | Freq: Every day | ORAL | 0 refills | Status: DC

## 2017-04-08 NOTE — ED Notes (Signed)
Pt states he understands instructions. Home stable with steady gait. 

## 2017-04-08 NOTE — ED Provider Notes (Signed)
[facility] DEPT Provider Note   CSN: [id number] Arrival date & time: 04/08/17  0606     History   Chief Complaint Chief Complaint  Patient presents with  . Chest Pain  . Shortness of Breath    HPI Larry Alcock is a 61 y.o. male.  HPI Domanik Rainville is a 61 y.o. male with hx of diabetes and htn, presents to ED with complaint of cough, shortness of breath. Patient states that he has had cough for approximately 3 weeks. He was seen in emergency department at that time, was given 5 day course of low-dose prednisone, Augmentin, inhaler. He has been using inhaler and finished the other 2 medications with no improvement. He reports history of pneumonias in the past and is worried this might be pneumonia. He denies any fevers at home. He reports some shortness of breath. He states his symptoms are worse when laying down at night. Denies swelling in extremities. Chest pain only worse with coughing. Reports history of the same. History of smoking for 25 years, quit 12 years ago. Does not have a PCP at this time.  Past Medical History:  Diagnosis Date  . Diabetes mellitus   . Hypertension     There are no active problems to display for this patient.   History reviewed. No pertinent surgical history.     Home Medications    Prior to Admission medications   Medication Sig Start Date End Date Taking? Authorizing Provider  amoxicillin-clavulanate (AUGMENTIN) 875-125 MG tablet Take 1 tablet by mouth every 12 (twelve) hours. 03/21/17   Carlisle Cater, PA-C  Ascorbic Acid (VITAMIN C) 1000 MG tablet Take 2,000 mg by mouth daily.    Historical Provider, MD  aspirin EC 325 MG tablet Take 325 mg by mouth daily as needed. Pain    Historical Provider, MD  CRANBERRY EXTRACT PO Take 1 tablet by mouth daily.    Historical Provider, MD  guaiFENesin (ROBITUSSIN) 100 MG/5ML liquid Take 5-10 mLs (100-200 mg total) by mouth every 4 (four) hours as needed for cough. 05/15/15   Montine Circle, PA-C    lisinopril (PRINIVIL,ZESTRIL) 10 MG tablet Take 1 tablet (10 mg total) by mouth daily. 03/21/17   Carlisle Cater, PA-C  magic mouthwash SOLN Take 5 mLs by mouth 3 (three) times daily as needed for mouth pain. 03/16/17   Hina Khatri, PA-C  Multiple Vitamins-Minerals (CENTRUM SILVER PO) Take 1 tablet by mouth daily.    Historical Provider, MD  naproxen (NAPROSYN) 375 MG tablet Take 375 mg by mouth 2 (two) times daily with a meal.    Historical Provider, MD  Phenyleph-Doxylamine-DM-APAP (NYQUIL SEVERE COLD/FLU) 5-6.25-10-325 MG/15ML LIQD Take 15 mLs by mouth at bedtime as needed. Sleep    Historical Provider, MD  predniSONE (DELTASONE) 20 MG tablet Take 2 tablets (40 mg total) by mouth daily. 03/21/17   Carlisle Cater, PA-C    Family History No family history on file.  Social History Social History  Substance Use Topics  . Smoking status: Never Smoker  . Smokeless tobacco: Never Used  . Alcohol use Yes     Comment: history of.     Allergies   Avelox [moxifloxacin hcl in nacl] and Alprazolam   Review of Systems Review of Systems  Constitutional: Negative for chills and fever.  HENT: Negative.   Respiratory: Positive for cough, chest tightness and shortness of breath.   Cardiovascular: Negative for chest pain, palpitations and leg swelling.  Gastrointestinal: Negative for abdominal distention, abdominal pain, diarrhea, nausea and  vomiting.  Genitourinary: Negative for dysuria, frequency, hematuria and urgency.  Musculoskeletal: Negative for arthralgias, myalgias, neck pain and neck stiffness.  Skin: Negative for rash.  Allergic/Immunologic: Negative for immunocompromised state.  Neurological: Negative for dizziness, weakness, light-headedness, numbness and headaches.  All other systems reviewed and are negative.    Physical Exam Updated Vital Signs BP (!) 151/94   Pulse 85   Temp 97.7 F (36.5 C) (Oral)   Resp 18   Ht 6\' 5"  (1.956 m)   SpO2 93%   Physical Exam   Constitutional: He appears well-developed and well-nourished. No distress.  HENT:  Head: Normocephalic and atraumatic.  Eyes: Conjunctivae are normal.  Neck: Neck supple.  Cardiovascular: Normal rate, regular rhythm and normal heart sounds.   Pulmonary/Chest: Effort normal. No respiratory distress. He has no wheezes. He has no rales.  Diminished bilaterally  Abdominal: Soft. Bowel sounds are normal. He exhibits no distension. There is no tenderness. There is no rebound.  Musculoskeletal: He exhibits no edema.  Neurological: He is alert.  Skin: Skin is warm and dry.  Nursing note and vitals reviewed.    ED Treatments / Results  Labs (all labs ordered are listed, but only abnormal results are displayed) Labs Reviewed  BASIC METABOLIC PANEL - Abnormal; Notable for the following:       Result Value   Glucose, Bld 105 (*)    All other components within normal limits  CBC  I-STAT TROPOININ, ED    EKG  EKG Interpretation  Date/Time:  Thursday April 08 2017 06:11:44 EDT Ventricular Rate:  88 PR Interval:  174 QRS Duration: 76 QT Interval:  370 QTC Calculation: 447 R Axis:   44 Text Interpretation:  Normal sinus rhythm Cannot rule out Anterior infarct , age undetermined Abnormal ECG No significant change was found Confirmed by Wyvonnia Dusky  MD, STEPHEN [phone]) on 04/08/2017 6:29:06 AM       Radiology Dg Chest 2 View  Result Date: 04/08/2017 CLINICAL DATA:  Diagnosis of pneumonia 3 weeks ago. Shortness of breath and chest pain is not improving EXAM: CHEST  2 VIEW COMPARISON:  03/21/2017 FINDINGS: Interstitial prominence is chronic and stable. There is no edema, consolidation, effusion, or pneumothorax. Normal heart size and stable aortic tortuosity. Flowing osteophytes with multilevel thoracic ankylosis. IMPRESSION: Stable compared to prior.  No evidence of active disease. Electronically Signed   By: Monte Fantasia M.D.   On: 04/08/2017 07:12    Procedures Procedures (including  critical care time)  Medications Ordered in ED Medications  predniSONE (DELTASONE) tablet 60 mg (not administered)  ipratropium-albuterol (DUONEB) 0.5-2.5 (3) MG/3ML nebulizer solution 3 mL (not administered)     Initial Impression / Assessment and Plan / ED Course  I have reviewed the triage vital signs and the nursing notes.  Pertinent labs & imaging results that were available during my care of the patient were reviewed by me and considered in my medical decision making (see chart for details).     Patient in emergency department with persistent cough for almost a month now. He reports shortness of breath. Chest pain, only worse with coughing. Will get labs and chest x-ray. Breathing treatment and prednisone ordered. Patient has normal vital signs including oxygen saturation. He is afebrile. Lungs are clear, however breath sounds are diminished bilaterally.   10:08 AM Patient received 2 breathing treatments, he deferred the third one because he was getting too jittery. He ambulated in the hallway, oxygen remained above 95% on room air. He states he  feels slightly better. I will start him on a longer prednisone course, treated with doxycycline, Tessalon. Follow-up with primary care doctor recommended. Also provided him with a spacer for his inhaler. His chest x-ray and lab work did not show any acute findings today. His vital signs remained normal. He is stable for discharge home.  Vitals:   04/08/17 0700 04/08/17 0800 04/08/17 0952 04/08/17 1001  BP: (!) 111/96 122/80 135/86   Pulse: 83 66 85   Resp: 16 10 (!) 26 16  Temp:   98.1 F (36.7 C)   TempSrc:   Oral   SpO2: 96% 98% 95%   Height:         Final Clinical Impressions(s) / ED Diagnoses   Final diagnoses:  Bronchitis    New Prescriptions New Prescriptions   BENZONATATE (TESSALON) 100 MG CAPSULE    Take 1 capsule (100 mg total) by mouth every 8 (eight) hours.   DOXYCYCLINE (VIBRAMYCIN) 100 MG CAPSULE    Take 1  capsule (100 mg total) by mouth 2 (two) times daily.   PREDNISONE (STERAPRED UNI-PAK 21 TAB) 10 MG (21) TBPK TABLET    Take by mouth daily. Take 6 tabs by mouth daily  for 2 days, then 5 tabs for 2 days, then 4 tabs for 2 days, then 3 tabs for 2 days, 2 tabs for 2 days, then 1 tab by mouth daily for 2 days     Jeannett Senior, PA-C 04/08/17 1010    Quintella Reichert, MD 04/09/17 [phone]

## 2017-04-08 NOTE — ED Triage Notes (Signed)
Pt states that about three weeks ago he was diagnosed with p[pneumonia an given medications, CP and SOB are not getting better, dry cough, no fevers, no radiation, denies n/v

## 2017-04-08 NOTE — Discharge Instructions (Signed)
Prednisone as prescribed until all gone. Doxycycline as prescribed until all gone. Use inhaler 2-4 puffs every 4 hrs. Tessalon for cough as needed. Follow up with family dotor

## 2017-04-08 NOTE — ED Notes (Signed)
Patient 02 stayed at 95% while walking.

## 2018-02-11 DIAGNOSIS — I10 Essential (primary) hypertension: Secondary | ICD-10-CM

## 2018-02-11 DIAGNOSIS — E785 Hyperlipidemia, unspecified: Secondary | ICD-10-CM

## 2018-02-11 HISTORY — DX: Hyperlipidemia, unspecified: E78.5

## 2018-02-11 HISTORY — DX: Essential (primary) hypertension: I10

## 2018-04-02 IMAGING — DX DG CHEST 2V
2 series · 2 of 2 positions shown · non-contrast
Comparison: Chest radiograph 05/15/2015

CLINICAL DATA: Patient with cough, sore throat and chest tightness.

EXAM:
CHEST  2 VIEW

[chest pa]
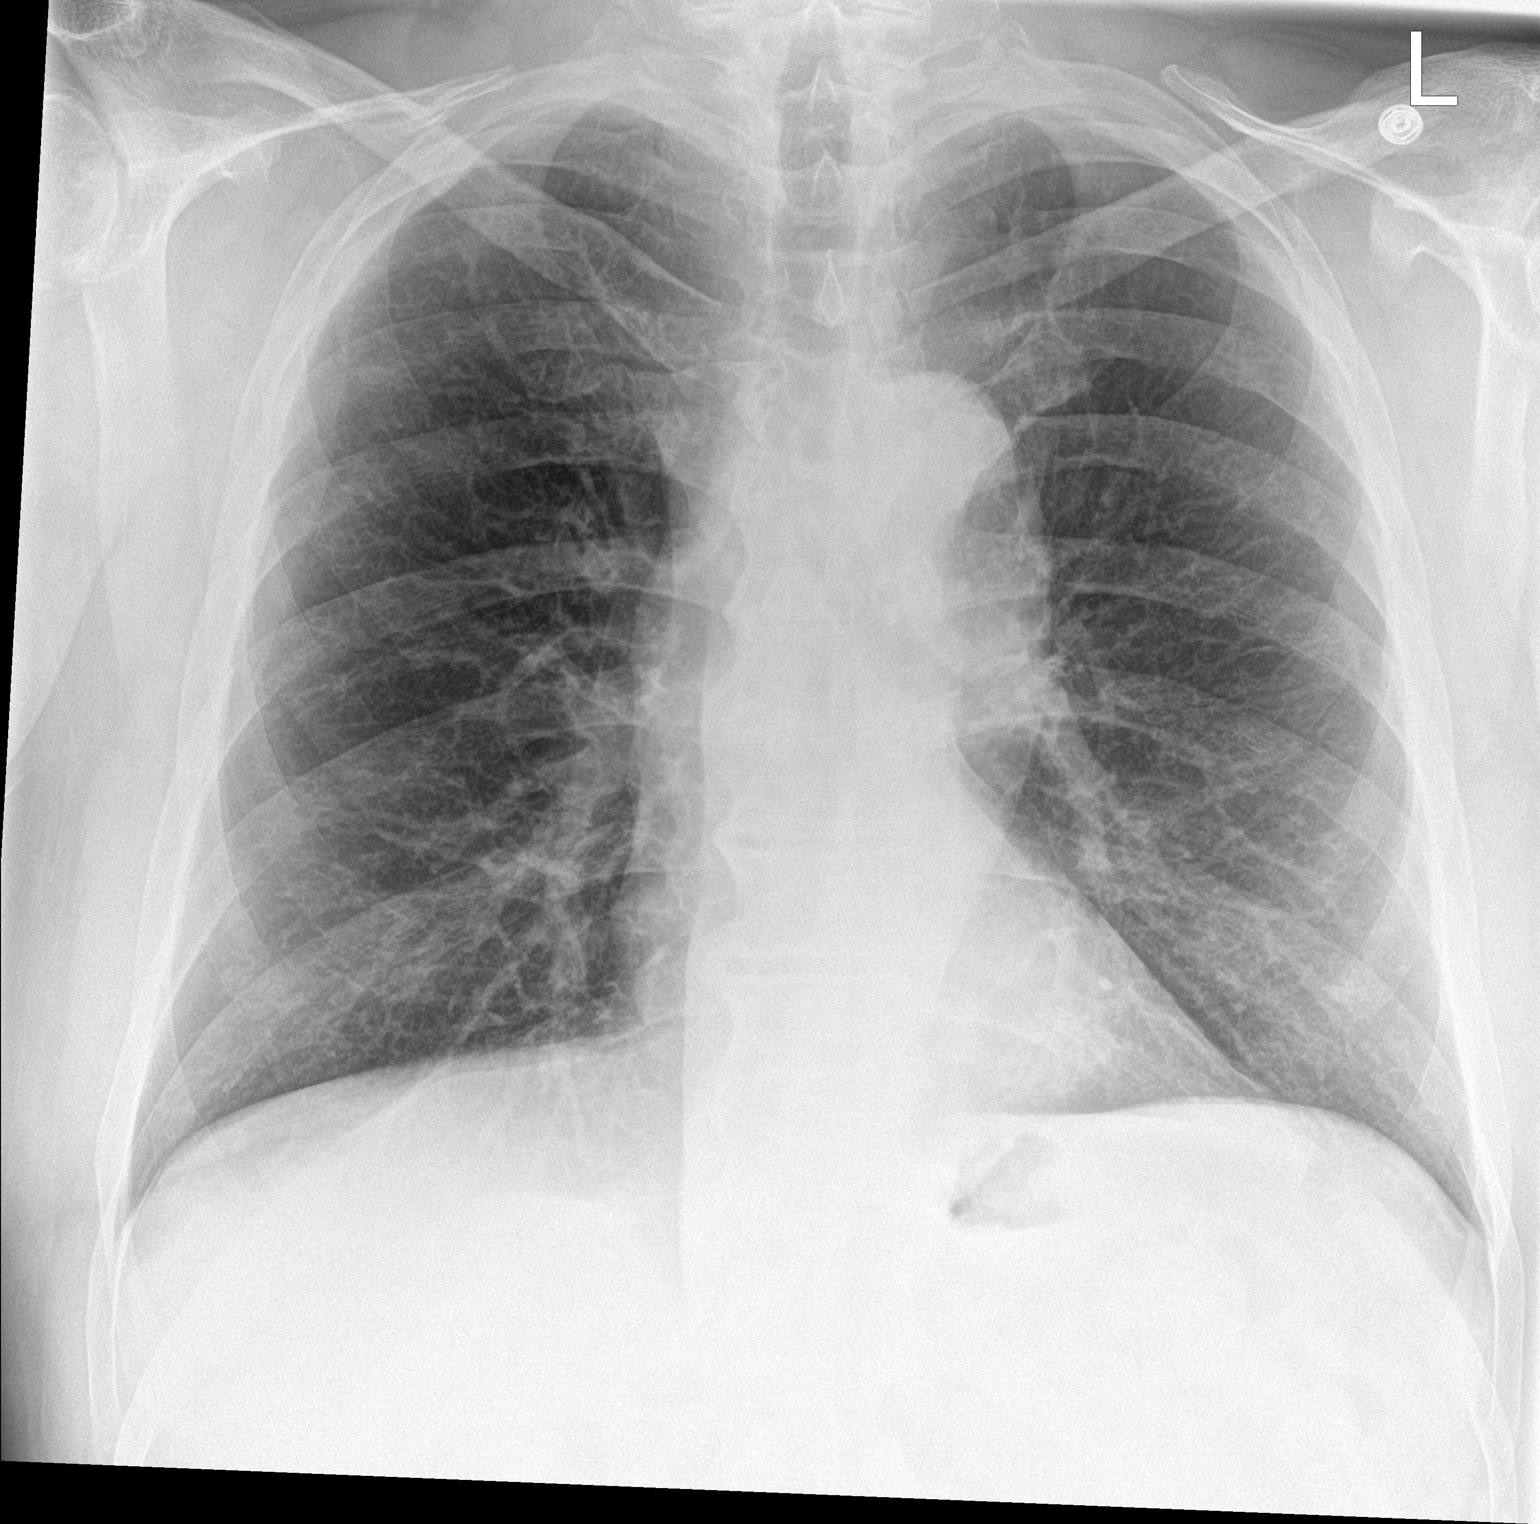

[chest lat]
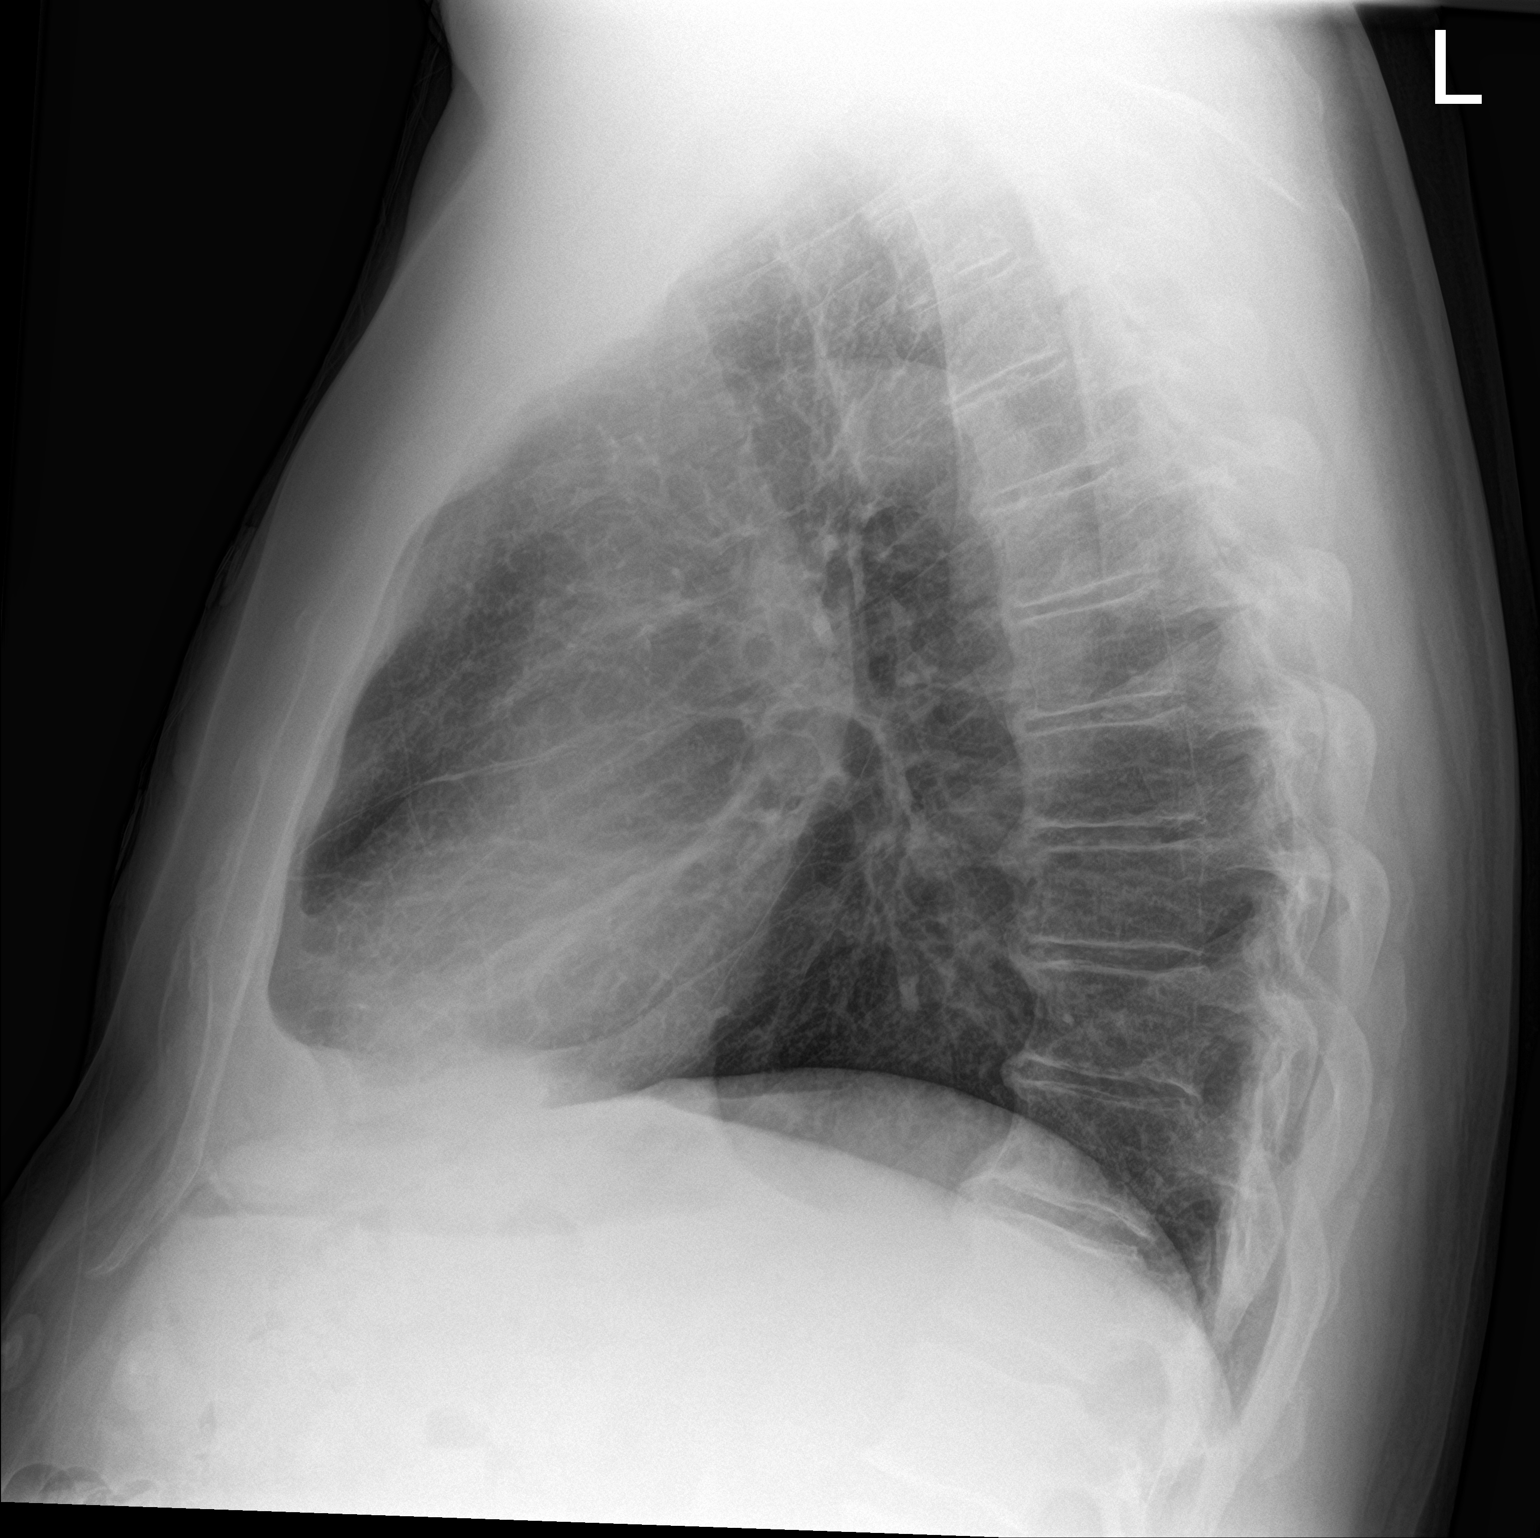

[2 of 2 positions shown; findings below may reference images not displayed]

FINDINGS: Stable cardiac and mediastinal contours. Tortuosity thoracic aorta.
No consolidative pulmonary opacities. No pleural effusion or
pneumothorax. Thoracic spine degenerative changes.
IMPRESSION: No active cardiopulmonary disease.

## 2020-01-20 ENCOUNTER — Other Ambulatory Visit: Payer: Self-pay

## 2020-01-20 ENCOUNTER — Ambulatory Visit (HOSPITAL_COMMUNITY)
Admission: EM | Admit: 2020-01-20 | Discharge: 2020-01-20 | Disposition: A | Discharge status: Home / Self Care | Payer: 59 | Attending: Family Medicine | Admitting: Family Medicine

## 2020-01-20 ENCOUNTER — Encounter (HOSPITAL_COMMUNITY): Payer: Self-pay

## 2020-01-20 DIAGNOSIS — J069 Acute upper respiratory infection, unspecified: Secondary | ICD-10-CM | POA: Insufficient documentation

## 2020-01-20 DIAGNOSIS — I10 Essential (primary) hypertension: Secondary | ICD-10-CM | POA: Diagnosis present

## 2020-01-20 DIAGNOSIS — Z20822 Contact with and (suspected) exposure to covid-19: Secondary | ICD-10-CM | POA: Diagnosis present

## 2020-01-20 MED ORDER — CETIRIZINE HCL 10 MG PO TABS: 10.0000 mg | ORAL_TABLET | Freq: Every day | ORAL | 0 refills | Status: DC

## 2020-01-20 MED ORDER — IPRATROPIUM BROMIDE 0.03 % NA SOLN: 2.0000 | Freq: Two times a day (BID) | NASAL | 0 refills | Status: DC

## 2020-01-20 MED ORDER — IPRATROPIUM BROMIDE 0.03 % NA SOLN: 2.0000 | Freq: Three times a day (TID) | NASAL | 0 refills | Status: DC

## 2020-01-20 MED ORDER — ALBUTEROL SULFATE HFA 108 (90 BASE) MCG/ACT IN AERS
2.0000 | INHALATION_SPRAY | Freq: Once | RESPIRATORY_TRACT | Status: AC
  Administered 2020-01-20: 2 via RESPIRATORY_TRACT

## 2020-01-20 MED ORDER — LISINOPRIL 10 MG PO TABS: 10.0000 mg | ORAL_TABLET | Freq: Every day | ORAL | 0 refills | Status: DC

## 2020-01-20 MED ORDER — ALBUTEROL SULFATE HFA 108 (90 BASE) MCG/ACT IN AERS
INHALATION_SPRAY | RESPIRATORY_TRACT | Status: AC
Start: 2020-01-20 — End: ?
  Filled 2020-01-20: qty 6.7

## 2020-01-20 MED ORDER — AEROCHAMBER PLUS FLO-VU MISC
1.0000 | Freq: Once | Status: AC
  Administered 2020-01-20: 13:00:00 1

## 2020-01-20 MED ORDER — LIDOCAINE VISCOUS HCL 2 % MT SOLN: 15.0000 mL | OROMUCOSAL | 0 refills | Status: DC | PRN

## 2020-01-20 MED ORDER — AEROCHAMBER PLUS FLO-VU LARGE MISC
Status: AC
  Filled 2020-01-20: qty 1

## 2020-01-20 NOTE — Discharge Instructions (Addendum)
Sign up for My Chart. Access code is listed on discharge paperwork. Refilled blood pressure medication for 3 months. Schedule a new patient appointment at  [facility] 19 results will be available in 48-72 hours. Negative results are immediately resulted to Mychart. All positive results are communicated with a phone call from our office.

## 2020-01-20 NOTE — ED Triage Notes (Signed)
Pt c/o "flu symptoms": burning sinus area, sore throat, dry cough, body aches, mild SOB with putting on shoes x3 days. Denies: fever, chills, abd pain, n/v/d, loss of taste/smell. States mild pain b/t scapula since last night. Denies CP, no pain to arms, diaphoresis.

## 2020-01-20 NOTE — ED Provider Notes (Signed)
[facility]    CSN: [id number] Arrival date & time: 01/20/20  1032      History   Chief Complaint No chief complaint on file.   HPI Glenn Lane is a 64 y.o. male.   HPI  Glenn Lane presents for evaluation of URI symptoms and COVID-19 testing. No known direct exposure to COVID-19. Symptoms include sinus congestion, sore throat, dry cough, or body aches, mild SOB. SOB with exertional activities only. Reports a history of pneumonia. Denies fever, chills, nausea, vomiting, or chest pain. High Risk for complications related to COVID-19 include: hypertension and diabetes.  Hypertension Patient suffers from hypertension and is currently without medication and currently is without a PCP. Denies chest pain. Endorses occasional exertional SOB. Previous prescribed lisinopril for hypertension management.   Past Medical History:  Diagnosis Date  . Diabetes mellitus   . Hypertension     There are no problems to display for this patient.   No past surgical history on file.     Home Medications    Prior to Admission medications   Medication Sig Start Date End Date Taking? Authorizing Provider  amoxicillin-clavulanate (AUGMENTIN) 875-125 MG tablet Take 1 tablet by mouth every 12 (twelve) hours. 03/21/17   Carlisle Cater, PA-C  Ascorbic Acid (VITAMIN C) 1000 MG tablet Take 2,000 mg by mouth daily.    [provider]  aspirin EC 325 MG tablet Take 325 mg by mouth daily as needed. Pain    [provider]  benzonatate (TESSALON) 100 MG capsule Take 1 capsule (100 mg total) by mouth every 8 (eight) hours. 04/08/17   Kirichenko, Tatyana, PA-C  CRANBERRY EXTRACT PO Take 1 tablet by mouth daily.    [provider]  doxycycline (VIBRAMYCIN) 100 MG capsule Take 1 capsule (100 mg total) by mouth 2 (two) times daily. 04/08/17   Kirichenko, Tatyana, PA-C  guaiFENesin (ROBITUSSIN) 100 MG/5ML liquid Take 5-10 mLs (100-200 mg total) by mouth every 4 (four) hours as  needed for cough. 05/15/15   Montine Circle, PA-C  lisinopril (PRINIVIL,ZESTRIL) 10 MG tablet Take 1 tablet (10 mg total) by mouth daily. 03/21/17   Carlisle Cater, PA-C  magic mouthwash SOLN Take 5 mLs by mouth 3 (three) times daily as needed for mouth pain. 03/16/17   Khatri, Hina, PA-C  Multiple Vitamins-Minerals (CENTRUM SILVER PO) Take 1 tablet by mouth daily.    [provider]  naproxen (NAPROSYN) 375 MG tablet Take 375 mg by mouth 2 (two) times daily with a meal.    [provider]  Phenyleph-Doxylamine-DM-APAP (NYQUIL SEVERE COLD/FLU) 5-6.25-10-325 MG/15ML LIQD Take 15 mLs by mouth at bedtime as needed. Sleep    [provider]  predniSONE (STERAPRED UNI-PAK 21 TAB) 10 MG (21) TBPK tablet Take by mouth daily. Take 6 tabs by mouth daily  for 2 days, then 5 tabs for 2 days, then 4 tabs for 2 days, then 3 tabs for 2 days, 2 tabs for 2 days, then 1 tab by mouth daily for 2 days 04/08/17   Jeannett Senior, PA-C    Family History No family history on file.  Social History Social History   Tobacco Use  . Smoking status: Never Smoker  . Smokeless tobacco: Never Used  Substance Use Topics  . Alcohol use: Yes    Comment: history of.  . Drug use: No     Allergies   Avelox [moxifloxacin hcl in nacl] and Alprazolam   Review of Systems Review of Systems Pertinent negatives listed in  HPI Physical Exam Triage Vital Signs ED Triage Vitals  Enc Vitals Group     BP      Pulse      Resp      Temp      Temp src      SpO2      Weight      Height      Head Circumference      Peak Flow      Pain Score      Pain Loc      Pain Edu?      Excl. in [place]?    No data found.  Updated Vital Signs BP (!) 164/101 (BP Location: Left Arm)   Pulse 80   Temp 97.9 F (36.6 C) (Oral)   Resp 16   SpO2 97%   Visual Acuity Right Eye Distance:   Left Eye Distance:   Bilateral Distance:    Right Eye Near:   Left Eye Near:    Bilateral Near:     Physical  Exam Vitals reviewed.  HENT:     Head: Normocephalic and atraumatic.     Right Ear: Hearing and tympanic membrane normal.     Left Ear: Hearing and tympanic membrane normal.     Nose: Mucosal edema and congestion present.     Mouth/Throat:     Mouth: Mucous membranes are moist.     Pharynx: Oropharynx is clear. Uvula midline.  Cardiovascular:     Rate and Rhythm: Normal rate and regular rhythm.     Pulses: Normal pulses.     Heart sounds: Normal heart sounds.  Pulmonary:     Effort: Pulmonary effort is normal.     Breath sounds: Normal breath sounds and air entry. No rhonchi or rales.  Musculoskeletal:     Cervical back: Normal range of motion.  Neurological:     Mental Status: He is alert and oriented to person, place, and time.  Psychiatric:        Attention and Perception: Attention normal.        Mood and Affect: Mood normal.      UC Treatments / Results  Labs (all labs ordered are listed, but only abnormal results are displayed) Labs Reviewed - No data to display  EKG   Radiology No results found.  Procedures Procedures (including critical care time)  Medications Ordered in UC Medications - No data to display  Initial Impression / Assessment and Plan / UC Course  I have reviewed the triage vital signs and the nursing notes.  Pertinent labs & imaging results that were available during my care of the patient were reviewed by me and considered in my medical decision making (see chart for details).     Upper respiratory viral illness, symptomatic management indicated only. Atrovent nasal spray TID PRN for congestion. Cetrizine 10 mg once daily  PRN. Lidocaine viscous for throat irritation.  Hypertension, Essential, resume lisinopril 10 mg once daily. Information provided to follow-up with [facility].  Encounter for COVID-19 testing, mild symptoms.  COVID education provided. Results available within 24-72 hours.If symptoms today worsen, return for  further evaluation. Final Clinical Impressions(s) / UC Diagnoses   Final diagnoses:  Essential hypertension  Encounter for laboratory testing for COVID-19 virus  Viral upper respiratory tract infection     Discharge Instructions     Sign up for My Chart. Access code is listed on discharge paperwork. Refilled blood pressure medication for 3 months. Schedule a new  patient appointment at  [facility] 19 results will be available in 48-72 hours. Negative results are immediately resulted to Mychart. All positive results are communicated with a phone call from our office.      ED Prescriptions    Medication Sig Dispense Auth. Provider   lisinopril (ZESTRIL) 10 MG tablet Take 1 tablet (10 mg total) by mouth daily. 90 tablet Scot Jun, FNP   lidocaine (XYLOCAINE) 2 % solution Use as directed 15 mLs in the mouth or throat as needed for mouth pain. 100 mL Scot Jun, FNP   ipratropium (ATROVENT) 0.03 % nasal spray  (Status: Discontinued) Place 2 sprays into both nostrils 2 (two) times daily. 30 mL Scot Jun, FNP   cetirizine (ZYRTEC) 10 MG tablet Take 1 tablet (10 mg total) by mouth daily. 30 tablet Scot Jun, FNP   ipratropium (ATROVENT) 0.03 % nasal spray Place 2 sprays into both nostrils 3 (three) times daily. 30 mL Scot Jun, FNP     PDMP not reviewed this encounter.   Scot Jun, [place] 01/21/20 [phone]

## 2020-01-21 LAB — NOVEL CORONAVIRUS, NAA (HOSP ORDER, SEND-OUT TO REF LAB; TAT 18-24 HRS): SARS-CoV-2, NAA: NOT DETECTED

## 2020-03-13 ENCOUNTER — Ambulatory Visit
Admission: EM | Admit: 2020-03-13 | Discharge: 2020-03-13 | Disposition: A | Discharge status: Home / Self Care | Payer: 59 | Attending: Emergency Medicine | Admitting: Emergency Medicine

## 2020-03-13 ENCOUNTER — Other Ambulatory Visit: Payer: Self-pay

## 2020-03-13 DIAGNOSIS — J029 Acute pharyngitis, unspecified: Secondary | ICD-10-CM | POA: Insufficient documentation

## 2020-03-13 DIAGNOSIS — K122 Cellulitis and abscess of mouth: Secondary | ICD-10-CM

## 2020-03-13 LAB — POCT RAPID STREP A (OFFICE): Rapid Strep A Screen: NEGATIVE

## 2020-03-13 MED ORDER — AMOXICILLIN 500 MG PO CAPS: 500.0000 mg | ORAL_CAPSULE | Freq: Two times a day (BID) | ORAL | 0 refills | Status: AC

## 2020-03-13 MED ORDER — PREDNISONE 20 MG PO TABS
20.0000 mg | ORAL_TABLET | Freq: Once | ORAL | Status: AC
  Administered 2020-03-13: 20 mg via ORAL

## 2020-03-13 NOTE — ED Provider Notes (Signed)
[facility]    CSN: [id number] Arrival date & time: 03/13/20  1013      History   Chief Complaint Chief Complaint  Patient presents with  . Sore Throat    HPI Glenn Lane is a 64 y.o. male with history of diabetes, hypertension presenting for approximately 1 month of sore throat.  Patient feels that his uvula is mildly enlarged from baseline: Denying difficulty breathing, swallowing, choking, drooling, dental pain, ear pain, fever.  States that he had a negative Covid test 3 weeks ago and denies acute worsening of symptoms, known sick contacts.  Patient states that he has tried lidocaine without significant relief.   Past Medical History:  Diagnosis Date  . Diabetes mellitus   . Hypertension     There are no problems to display for this patient.   History reviewed. No pertinent surgical history.     Home Medications    Prior to Admission medications   Medication Sig Start Date End Date Taking? Authorizing Provider  amoxicillin (AMOXIL) 500 MG capsule Take 1 capsule (500 mg total) by mouth 2 (two) times daily for 7 days. 03/13/20 03/20/20  Hall-Potvin, [place], PA-C  Ascorbic Acid (VITAMIN C) 1000 MG tablet Take 2,000 mg by mouth daily.    [provider]  aspirin EC 325 MG tablet Take 325 mg by mouth daily as needed. Pain    [provider]  cetirizine (ZYRTEC) 10 MG tablet Take 1 tablet (10 mg total) by mouth daily. 01/20/20   Scot Jun, FNP  CRANBERRY EXTRACT PO Take 1 tablet by mouth daily.    [provider]  ipratropium (ATROVENT) 0.03 % nasal spray Place 2 sprays into both nostrils 3 (three) times daily. 01/20/20   Scot Jun, FNP  lidocaine (XYLOCAINE) 2 % solution Use as directed 15 mLs in the mouth or throat as needed for mouth pain. 01/20/20   Scot Jun, FNP  lisinopril (ZESTRIL) 10 MG tablet Take 1 tablet (10 mg total) by mouth daily. 01/20/20   Scot Jun, FNP  Multiple Vitamins-Minerals  (CENTRUM SILVER PO) Take 1 tablet by mouth daily.    [provider]    Family History Family History  Problem Relation Age of Onset  . Heart failure Mother   . Heart failure Father   . Hyperlipidemia Father     Social History Social History   Tobacco Use  . Smoking status: Never Smoker  . Smokeless tobacco: Never Used  Substance Use Topics  . Alcohol use: Yes    Comment: history of.  . Drug use: No     Allergies   Avelox [moxifloxacin hcl in nacl] and Alprazolam   Review of Systems As per HPI   Physical Exam Triage Vital Signs ED Triage Vitals  Enc Vitals Group     BP      Pulse      Resp      Temp      Temp src      SpO2      Weight      Height      Head Circumference      Peak Flow      Pain Score      Pain Loc      Pain Edu?      Excl. in [place]?    No data found.  Updated Vital Signs BP (!) 154/97 (BP Location: Left Arm)   Pulse 77   Temp 98.1 F (36.7  C) (Oral)   Resp 16   SpO2 94%   Visual Acuity Right Eye Distance:   Left Eye Distance:   Bilateral Distance:    Right Eye Near:   Left Eye Near:    Bilateral Near:     Physical Exam Constitutional:      General: He is not in acute distress.    Appearance: He is obese. He is not ill-appearing or diaphoretic.  HENT:     Head: Normocephalic and atraumatic.     Right Ear: Tympanic membrane, ear canal and external ear normal.     Left Ear: Tympanic membrane, ear canal and external ear normal.     Nose: No nasal deformity, congestion or rhinorrhea.     Mouth/Throat:     Mouth: Mucous membranes are moist.     Tongue: Tongue does not deviate from midline.     Pharynx: Oropharynx is clear. Uvula midline. Posterior oropharyngeal erythema and uvula swelling present. No oropharyngeal exudate.     Comments: No tonsillar hypertrophy or exudate Eyes:     General: No scleral icterus.    Conjunctiva/sclera: Conjunctivae normal.     Pupils: Pupils are equal, round, and reactive to light.   Neck:     Thyroid: No thyromegaly.     Comments: Bilateral superior cervical LAD that is mildly tender. Cardiovascular:     Rate and Rhythm: Normal rate and regular rhythm.  Pulmonary:     Effort: Pulmonary effort is normal. No respiratory distress.     Breath sounds: No wheezing.  Musculoskeletal:     Cervical back: Normal range of motion and neck supple. No muscular tenderness.  Lymphadenopathy:     Cervical: Cervical adenopathy present.  Skin:    Capillary Refill: Capillary refill takes less than 2 seconds.     Coloration: Skin is not pale.     Findings: No rash.  Neurological:     Mental Status: He is alert.      UC Treatments / Results  Labs (all labs ordered are listed, but only abnormal results are displayed) Labs Reviewed  POCT RAPID STREP A (OFFICE) - Normal  CULTURE, GROUP A STREP [facility])    EKG   Radiology No results found.  Procedures Procedures (including critical care time)  Medications Ordered in UC Medications  predniSONE (DELTASONE) tablet 20 mg (20 mg Oral Given 03/13/20 1124)    Initial Impression / Assessment and Plan / UC Course  I have reviewed the triage vital signs and the nursing notes.  Pertinent labs & imaging results that were available during my care of the patient were reviewed by me and considered in my medical decision making (see chart for details).     Patient afebrile, nontoxic in office.  No sign of airway compromise at this time.  Rapid strep negative, culture pending.  Given uvulitis and chronicity, will trial antibiotic and have patient follow-up with PCP for repeat evaluation if needed.  Patient given 20 mg Deltasone in office which he tolerated well.  Reviewed supportive management as outlined below.  Return precautions discussed, patient verbalized understanding and is agreeable to plan. Final Clinical Impressions(s) / UC Diagnoses   Final diagnoses:  Sore throat     Discharge Instructions     Your rapid strep test  was negative today.  The culture is pending.  Please look on your MyChart for test results.   We will notify you if the culture positive and outline a treatment plan at that time.   Please  continue Tylenol and/or Ibuprofen as needed for fever, pain.  May try warm salt water gargles, cepacol lozenges, throat spray, warm tea or water with lemon/honey, or OTC cold relief medicine for throat discomfort.   For congestion: take a daily anti-histamine like Zyrtec, Claritin, and a oral decongestant to help with post nasal drip that may be irritating your throat.   It is important to stay hydrated: drink plenty of fluids (primarily water) to keep your throat moisturized and help further relieve irritation/discomfort.  ed relieve irritation.     ED Prescriptions    Medication Sig Dispense Auth. Provider   amoxicillin (AMOXIL) 500 MG capsule Take 1 capsule (500 mg total) by mouth 2 (two) times daily for 7 days. 14 capsule Hall-Potvin, [place], PA-C     PDMP not reviewed this encounter.   Hall-Potvin, [place], [place] 03/13/20 1139

## 2020-03-13 NOTE — ED Triage Notes (Signed)
Pt c/o sore throat for over 3 weeks and was treated with an inhaler and lidocaine. States his sx's has gotten worse in the past week. States had a neg COVID test

## 2020-03-13 NOTE — Discharge Instructions (Signed)
Your rapid strep test was negative today.  The culture is pending.  Please look on your MyChart for test results.   We will notify you if the culture positive and outline a treatment plan at that time.   Please continue Tylenol and/or Ibuprofen as needed for fever, pain.  May try warm salt water gargles, cepacol lozenges, throat spray, warm tea or water with lemon/honey, or OTC cold relief medicine for throat discomfort.   For congestion: take a daily anti-histamine like Zyrtec, Claritin, and a oral decongestant to help with post nasal drip that may be irritating your throat.   It is important to stay hydrated: drink plenty of fluids (primarily water) to keep your throat moisturized and help further relieve irritation/discomfort.  ed relieve irritation.

## 2020-03-16 LAB — CULTURE, GROUP A STREP (THRC)

## 2020-04-23 ENCOUNTER — Other Ambulatory Visit: Payer: Self-pay

## 2020-04-23 ENCOUNTER — Ambulatory Visit (INDEPENDENT_AMBULATORY_CARE_PROVIDER_SITE_OTHER): Payer: 59 | Admitting: Internal Medicine

## 2020-04-23 DIAGNOSIS — K122 Cellulitis and abscess of mouth: Secondary | ICD-10-CM | POA: Diagnosis not present

## 2020-04-23 DIAGNOSIS — Z7689 Persons encountering health services in other specified circumstances: Secondary | ICD-10-CM | POA: Diagnosis not present

## 2020-04-23 DIAGNOSIS — Z114 Encounter for screening for human immunodeficiency virus [HIV]: Secondary | ICD-10-CM

## 2020-04-23 DIAGNOSIS — I10 Essential (primary) hypertension: Secondary | ICD-10-CM | POA: Diagnosis not present

## 2020-04-23 DIAGNOSIS — Z1159 Encounter for screening for other viral diseases: Secondary | ICD-10-CM

## 2020-04-23 DIAGNOSIS — E785 Hyperlipidemia, unspecified: Secondary | ICD-10-CM

## 2020-04-23 MED ORDER — LISINOPRIL-HYDROCHLOROTHIAZIDE 20-12.5 MG PO TABS: 1.0000 | ORAL_TABLET | Freq: Every day | ORAL | 3 refills | Status: DC

## 2020-04-23 MED ORDER — ATORVASTATIN CALCIUM 40 MG PO TABS: 40.0000 mg | ORAL_TABLET | Freq: Every day | ORAL | 0 refills | Status: DC

## 2020-04-23 NOTE — Addendum Note (Signed)
Addended by: Carylon Perches on: 04/23/2020 11:15 AM   Modules accepted: Orders

## 2020-04-23 NOTE — Progress Notes (Signed)
Virtual Visit via Telephone Note  I connected with Glenn Lane, on 04/23/2020 at 10:56 AM by telephone due to the COVID-19 pandemic and verified that I am speaking with the correct person using two identifiers.   Consent: I discussed the limitations, risks, security and privacy concerns of performing an evaluation and management service by telephone and the availability of in person appointments. I also discussed with the patient that there may be a patient responsible charge related to this service. The patient expressed understanding and agreed to proceed.   Location of Patient: Home   Location of Provider: Clinic    Persons participating in Telemedicine visit: Edrei Loeber [facility] Dr. Juleen China      History of Present Illness: Patient has a visit to establish care.   Patient was seen in Urgent Care on 3/17 for chronic sore throat. Uvulitis and cervical LAD was present. He was given steroid in the office and prescribed Amoxicillin. Strep culture was neg. Patient reports this has now resolved with treatment and he is feeling better.   Chronic HTN Disease Monitoring:  Home BP Monitoring - 150/90s  Chest pain- no  Dyspnea- no Headache - no  Medications: HCTZ 12.5 mg, Lisinopril 10 mg  Compliance- yes Lightheadedness- no  Edema- no    Past Medical History:  Diagnosis Date  . Diabetes mellitus   . Hypertension    Allergies  Allergen Reactions  . Avelox [Moxifloxacin Hcl In Nacl] Shortness Of Breath  . Alprazolam Other (See Comments)    Panic attacks  . Avelox  [Moxifloxacin Hcl] Other (See Comments)    Current Outpatient Medications on File Prior to Visit  Medication Sig Dispense Refill  . Ascorbic Acid (VITAMIN C) 1000 MG tablet Take 2,000 mg by mouth daily.    Marland Kitchen aspirin EC 325 MG tablet Take 325 mg by mouth daily as needed. Pain    . atorvastatin (LIPITOR) 40 MG tablet Take 1 tablet by mouth daily.    Marland Kitchen CRANBERRY EXTRACT PO Take 1  tablet by mouth daily.    . hydrochlorothiazide (HYDRODIURIL) 12.5 MG tablet Take 1 tablet by mouth daily.    Marland Kitchen lisinopril (ZESTRIL) 10 MG tablet Take 1 tablet (10 mg total) by mouth daily. 90 tablet 0  . Multiple Vitamins-Minerals (CENTRUM SILVER PO) Take 1 tablet by mouth daily.     No current facility-administered medications on file prior to visit.    Observations/Objective: NAD. Speaking clearly.  Work of breathing normal.  Alert and oriented. Mood appropriate.   Assessment and Plan: 1. Encounter to establish care  2. Uvulitis Resolved.   3. Essential hypertension BP is above goal. Will increase Lisinopril to 20 mg. Patient prefers combination medication. Needs blood work checked due to medications.  Counseled on blood pressure goal of less than 130/80, low-sodium, DASH diet, medication compliance, 150 minutes of moderate intensity exercise per week. Discussed medication compliance, adverse effects. - lisinopril-hydrochlorothiazide (ZESTORETIC) 20-12.5 MG tablet; Take 1 tablet by mouth daily.  Dispense: 90 tablet; Refill: 3 - Comprehensive metabolic panel - CBC  4. Hyperlipidemia, unspecified hyperlipidemia type Patient has been without Lipitor for several months. Will check lipid panel and restart statin therapy.  - atorvastatin (LIPITOR) 40 MG tablet; Take 1 tablet (40 mg total) by mouth daily.  Dispense: 90 tablet; Refill: 0 - Lipid panel  5. Screening for HIV (human immunodeficiency virus) - HIV Antibody (routine testing w rflx)  6. Need for hepatitis C screening test - Hepatitis C antibody   Follow Up Instructions:  3 month f/u for chronic medical conditions    I discussed the assessment and treatment plan with the patient. The patient was provided an opportunity to ask questions and all were answered. The patient agreed with the plan and demonstrated an understanding of the instructions.   The patient was advised to call back or seek an in-person evaluation if  the symptoms worsen or if the condition fails to improve as anticipated.     I provided 14 minutes total of non-face-to-face time during this encounter including median intraservice time, reviewing previous notes, investigations, ordering medications, medical decision making, coordinating care and patient verbalized understanding at the end of the visit.    Phill Myron, D.O. Primary Care at [facility]  04/23/2020, 10:56 AM

## 2020-04-29 ENCOUNTER — Other Ambulatory Visit: Payer: Self-pay

## 2020-04-29 ENCOUNTER — Other Ambulatory Visit (INDEPENDENT_AMBULATORY_CARE_PROVIDER_SITE_OTHER): Payer: 59

## 2020-04-29 DIAGNOSIS — Z114 Encounter for screening for human immunodeficiency virus [HIV]: Secondary | ICD-10-CM

## 2020-04-29 DIAGNOSIS — I10 Essential (primary) hypertension: Secondary | ICD-10-CM

## 2020-04-29 DIAGNOSIS — Z1159 Encounter for screening for other viral diseases: Secondary | ICD-10-CM | POA: Diagnosis not present

## 2020-04-29 DIAGNOSIS — E785 Hyperlipidemia, unspecified: Secondary | ICD-10-CM

## 2020-04-30 LAB — LIPID PANEL
Chol/HDL Ratio: 4.2 ratio (ref 0.0–5.0)
Cholesterol, Total: 195 mg/dL (ref 100–199)
HDL: 46 mg/dL (ref >39)
LDL Chol Calc (NIH): 110 mg/dL — ABNORMAL HIGH (ref 0–99)
Triglycerides: 225 mg/dL — ABNORMAL HIGH (ref 0–149)
VLDL Cholesterol Cal: 39 mg/dL (ref 5–40)

## 2020-04-30 LAB — COMPREHENSIVE METABOLIC PANEL
ALT: 23 IU/L (ref 0–44)
AST: 21 IU/L (ref 0–40)
Albumin/Globulin Ratio: 1.7 (ref 1.2–2.2)
Albumin: 4.2 g/dL (ref 3.8–4.8)
Alkaline Phosphatase: 90 IU/L (ref 39–117)
BUN/Creatinine Ratio: 15 (ref 10–24)
BUN: 17 mg/dL (ref 8–27)
Bilirubin Total: 0.7 mg/dL (ref 0.0–1.2)
CO2: 25 mmol/L (ref 20–29)
Calcium: 9.6 mg/dL (ref 8.6–10.2)
Chloride: 102 mmol/L (ref 96–106)
Creatinine, Ser: 1.12 mg/dL (ref 0.76–1.27)
GFR calc Af Amer: 80 mL/min/{1.73_m2} (ref >59)
GFR calc non Af Amer: 69 mL/min/{1.73_m2} (ref >59)
Globulin, Total: 2.5 g/dL (ref 1.5–4.5)
Glucose: 97 mg/dL (ref 65–99)
Potassium: 4.1 mmol/L (ref 3.5–5.2)
Sodium: 142 mmol/L (ref 134–144)
Total Protein: 6.7 g/dL (ref 6.0–8.5)

## 2020-04-30 LAB — CBC
Hematocrit: 50.8 % (ref 37.5–51.0)
Hemoglobin: 18.1 g/dL — ABNORMAL HIGH (ref 13.0–17.7)
MCH: 31.5 pg (ref 26.6–33.0)
MCHC: 35.6 g/dL (ref 31.5–35.7)
MCV: 89 fL (ref 79–97)
Platelets: 182 10*3/uL (ref 150–450)
RBC: 5.74 x10E6/uL (ref 4.14–5.80)
RDW: 13.1 % (ref 11.6–15.4)
WBC: 9.1 10*3/uL (ref 3.4–10.8)

## 2020-04-30 LAB — HEPATITIS C ANTIBODY: Hep C Virus Ab: <0.1 s/co ratio (ref 0.0–0.9)

## 2020-04-30 LAB — HIV ANTIBODY (ROUTINE TESTING W REFLEX): HIV Screen 4th Generation wRfx: NONREACTIVE

## 2020-05-01 ENCOUNTER — Other Ambulatory Visit: Payer: Self-pay | Admitting: Internal Medicine

## 2020-05-01 MED ORDER — ASPIRIN 81 MG PO TBEC: 81.0000 mg | DELAYED_RELEASE_TABLET | Freq: Every day | ORAL | 12 refills | Status: DC

## 2020-05-03 NOTE — Progress Notes (Signed)
Patient notified of results & recommendations. Expressed understanding.

## 2020-05-08 ENCOUNTER — Telehealth: Payer: Self-pay

## 2020-05-08 NOTE — Telephone Encounter (Signed)
Called patient to do their pre-visit COVID screening.  Call went to voicemail. Unable to do prescreening.  

## 2020-05-09 ENCOUNTER — Ambulatory Visit (INDEPENDENT_AMBULATORY_CARE_PROVIDER_SITE_OTHER): Payer: 59 | Admitting: Internal Medicine

## 2020-05-09 ENCOUNTER — Encounter: Payer: Self-pay | Admitting: Internal Medicine

## 2020-05-09 ENCOUNTER — Other Ambulatory Visit: Payer: Self-pay

## 2020-05-09 VITALS — BP 149/105 | HR 73 | Temp 97.2°F | Resp 17 | Ht 76.0 in | Wt 326.0 lb

## 2020-05-09 DIAGNOSIS — H53131 Sudden visual loss, right eye: Secondary | ICD-10-CM

## 2020-05-09 DIAGNOSIS — H538 Other visual disturbances: Secondary | ICD-10-CM

## 2020-05-09 NOTE — Patient Instructions (Addendum)
You have been scheduled to see Dr. Nicki Reaper at [facility] on 05/09/2020 @ 1 PM. Their address is: [address] [place], [place] [address].

## 2020-05-09 NOTE — Progress Notes (Signed)
Subjective:    Glenn Lane - 64 y.o. male MRN [id number]  Date of birth: 07-27-1956  HPI  Glenn Lane is here for blurry vision in his right eye x2 weeks. It has been progressively worsening. Reports seems blurry like wavy lines. Denies floaters, black spots, curtain of vision loss. He feels like vision, while significantly impaired, is consistent across all fields of his right eye, still has peripheral and central vision per patient. Denies trauma to the area. Not contact lens wearer. Reports redness to the eye which he attributes to working a lot and working night shifts. Was previously using Visine Red Eyes consistently but stopped when the vision changes started. No drainage from the eye. Vision change has been non-painful. Denies any recent severe headaches, extremity weakness, extremity numbness, or chest pain. No nausea or vomiting.     Hearing Screening   125Hz  250Hz  500Hz  1000Hz  2000Hz  3000Hz  4000Hz  6000Hz  8000Hz   Right ear:           Left ear:             Visual Acuity Screening   Right eye Left eye Both eyes  Without correction: 20/100 20/40 20/40  With correction:       Health Maintenance:  Health Maintenance Due  Topic Date Due  . COVID-19 Vaccine (1) Never done  . TETANUS/TDAP  Never done  . COLONOSCOPY  Never done    -  reports that he has never smoked. He has never used smokeless tobacco. - Review of Systems: Per HPI. - Past Medical History: Patient Active Problem List   Diagnosis Date Noted  . Essential hypertension 02/11/2018  . Hyperlipidemia 02/11/2018   - Medications: reviewed and updated   Objective:   Physical Exam BP (!) 149/105   Pulse 73   Temp (!) 97.2 F (36.2 C) (Temporal)   Resp 17   Ht 6\' 4"  (1.93 m)   Wt (!) 326 lb (147.9 kg)   SpO2 96%   BMI 39.68 kg/m  Physical Exam  Constitutional: He is oriented to person, place, and time and well-developed, well-nourished, and in no distress. No distress.  HENT:  No dulling of corneal color  appreciated. Some streaky redness of conjunctiva present bilaterally. No frank accumulations of blood appreciated. Right pupil is reactive but appears somewhat sluggish compared to left. EOMI are intact and non painful. Red light reflex intact.   Cardiovascular: Normal rate.  Pulmonary/Chest: Effort normal. No respiratory distress.  Musculoskeletal:        General: Normal range of motion.  Neurological: He is alert and oriented to person, place, and time.  Skin: Skin is warm and dry. He is not diaphoretic.  Psychiatric: Affect and judgment normal.           Assessment & Plan:   1. Blurry vision 2. Sudden visual loss of right eye Given acute changes in vision that are worsening with documented significant loss of visual acuity on exam, warrants urgent evaluation by ophthalmology. Patient has an appointment today at 1300 at [facility]. No history of recent eye surgery. Does not wear contacts and denies trauma to the eye making corneal abrasion, anterior uveitis, lens dislocation, etc less likely. I remain concerned for defect in the retina or optic nerve as cause of his symptoms. History does not sound consistent with retinal detachment but retinal artery or vein occlusion remain on differential. Acute maculopathy and ischemic optic neuropathy are also possibilities.       Phill Myron, D.O. 05/09/2020,  9:10 AM Primary Care at [facility]

## 2020-05-23 ENCOUNTER — Telehealth (INDEPENDENT_AMBULATORY_CARE_PROVIDER_SITE_OTHER): Payer: 59 | Admitting: Internal Medicine

## 2020-05-23 ENCOUNTER — Other Ambulatory Visit: Payer: Self-pay

## 2020-05-23 ENCOUNTER — Encounter: Payer: Self-pay | Admitting: Internal Medicine

## 2020-05-23 DIAGNOSIS — R05 Cough: Secondary | ICD-10-CM

## 2020-05-23 DIAGNOSIS — H34831 Tributary (branch) retinal vein occlusion, right eye, with macular edema: Secondary | ICD-10-CM

## 2020-05-23 DIAGNOSIS — R058 Other specified cough: Secondary | ICD-10-CM

## 2020-05-23 MED ORDER — BENZONATATE 100 MG PO CAPS: 100.0000 mg | ORAL_CAPSULE | Freq: Two times a day (BID) | ORAL | 1 refills | Status: DC | PRN

## 2020-05-23 NOTE — Progress Notes (Signed)
Virtual Visit via Telephone Note  I connected with Glenn Lane, on 05/23/2020 at 9:19 AM by telephone due to the COVID-19 pandemic and verified that I am speaking with the correct person using two identifiers.   Consent: I discussed the limitations, risks, security and privacy concerns of performing an evaluation and management service by telephone and the availability of in person appointments. I also discussed with the patient that there may be a patient responsible charge related to this service. The patient expressed understanding and agreed to proceed.   Location of Patient: Home   Location of Provider: Clinic    Persons participating in Telemedicine visit: Jaxson Weisman [facility] Dr. Juleen China    History of Present Illness: Patient has a visit for cough. Has been present for 2-3 weeks. Reports dry cough sometimes with a little bit of phlegm but not much. Very occasional SOB. Does endorse allergy symptoms. Taking OTC anti histamines. Thinks wearing the mask is the biggest cause of his cough. Does endorse itchy, scratchy throat. Not having any reflux like symptoms or heartburn. No fevers. Reports he has tried nasal spray for cough without relief. Has albuterol inhaler---not using every day just "when he gets bad". Patient doesn't know if he has a diagnosis of asthma or COPD. His smart watch tells him he has O2 of 97-98%.    Past Medical History:  Diagnosis Date  . Diabetes mellitus   . Hypertension    Allergies  Allergen Reactions  . Avelox [Moxifloxacin Hcl In Nacl] Shortness Of Breath  . Alprazolam Other (See Comments)    Panic attacks  . Avelox  [Moxifloxacin Hcl] Other (See Comments)    Current Outpatient Medications on File Prior to Visit  Medication Sig Dispense Refill  . Ascorbic Acid (VITAMIN C) 1000 MG tablet Take 2,000 mg by mouth daily.    Marland Kitchen aspirin (EC-81 ASPIRIN) 81 MG EC tablet Take 1 tablet (81 mg total) by mouth daily. Swallow whole. 30 tablet 12  .  atorvastatin (LIPITOR) 40 MG tablet Take 1 tablet (40 mg total) by mouth daily. 90 tablet 0  . hydrochlorothiazide (HYDRODIURIL) 12.5 MG tablet Take 1 tablet by mouth daily.    Marland Kitchen lisinopril-hydrochlorothiazide (ZESTORETIC) 20-12.5 MG tablet Take 1 tablet by mouth daily. 90 tablet 3  . Multiple Vitamins-Minerals (CENTRUM SILVER PO) Take 1 tablet by mouth daily.     No current facility-administered medications on file prior to visit.    Observations/Objective: NAD. Speaking clearly.  Work of breathing normal.  Alert and oriented. Mood appropriate.   Assessment and Plan: 1. Dry cough Unclear etiology. May be related to his allergy symptoms although taking many medications for this without improvement. Could consider silent reflux as etiology. Patient is on Lisinopril which could cause dry cough. Given associated SOB and use of inhaler in the past with unknown diagnosis of COPD or asthma, have scheduled with Dr. Joya Gaskins for further evaluation. Would obtain PFTs if possible and also consider CXR. In meantime, will try cough suppression for symptom control.  - benzonatate (TESSALON) 100 MG capsule; Take 1 capsule (100 mg total) by mouth 2 (two) times daily as needed for cough.  Dispense: 30 capsule; Refill: 1  2. Branch retinal vein occlusion of right eye with macular edema Managed by ophthalmology. Will need to work on optimal control of chronic medical conditions.    Follow Up Instructions: Consult with Dr. Joya Gaskins 6/15, f/u HTN with PCP 6/24    I discussed the assessment and treatment plan with the patient.  The patient was provided an opportunity to ask questions and all were answered. The patient agreed with the plan and demonstrated an understanding of the instructions.   The patient was advised to call back or seek an in-person evaluation if the symptoms worsen or if the condition fails to improve as anticipated.     I provided 20 minutes total of non-face-to-face time during this  encounter including median intraservice time, reviewing previous notes, investigations, ordering medications, medical decision making, coordinating care and patient verbalized understanding at the end of the visit.    Phill Myron, D.O. Primary Care at [facility]  05/23/2020, 9:19 AM

## 2020-06-10 NOTE — Progress Notes (Deleted)
   Subjective:    Patient ID: Glenn Lane, male    DOB: May 30, 1956, 64 y.o.   MRN: [id number]  63 y.o.M  Here for dry cough and poss copd. Hx of HTN HL       Review of Systems     Objective:   Physical Exam        Assessment & Plan:

## 2020-06-11 ENCOUNTER — Ambulatory Visit: Payer: 59 | Admitting: Critical Care Medicine

## 2020-06-19 ENCOUNTER — Telehealth: Payer: Self-pay

## 2020-06-19 NOTE — Telephone Encounter (Signed)
Called patient to do their pre-visit COVID screening.  Call went to voicemail. Unable to do prescreening.  

## 2020-06-20 ENCOUNTER — Ambulatory Visit: Payer: 59 | Admitting: Internal Medicine

## 2020-08-02 ENCOUNTER — Ambulatory Visit: Payer: 59 | Admitting: Internal Medicine

## 2020-08-06 ENCOUNTER — Other Ambulatory Visit: Payer: Self-pay

## 2020-08-06 DIAGNOSIS — E785 Hyperlipidemia, unspecified: Secondary | ICD-10-CM

## 2020-10-18 ENCOUNTER — Other Ambulatory Visit: Payer: Self-pay

## 2020-10-18 ENCOUNTER — Emergency Department (HOSPITAL_COMMUNITY): Payer: Medicare HMO

## 2020-10-18 ENCOUNTER — Emergency Department (HOSPITAL_COMMUNITY)
Admission: EM | Admit: 2020-10-18 | Discharge: 2020-10-18 | Disposition: A | Discharge status: Home / Self Care | Payer: Medicare HMO | Attending: Emergency Medicine | Admitting: Emergency Medicine

## 2020-10-18 DIAGNOSIS — F419 Anxiety disorder, unspecified: Secondary | ICD-10-CM

## 2020-10-18 DIAGNOSIS — F410 Panic disorder [episodic paroxysmal anxiety] without agoraphobia: Secondary | ICD-10-CM | POA: Diagnosis not present

## 2020-10-18 DIAGNOSIS — D110 Benign neoplasm of parotid gland: Secondary | ICD-10-CM | POA: Diagnosis not present

## 2020-10-18 DIAGNOSIS — E119 Type 2 diabetes mellitus without complications: Secondary | ICD-10-CM | POA: Diagnosis not present

## 2020-10-18 DIAGNOSIS — I10 Essential (primary) hypertension: Secondary | ICD-10-CM | POA: Diagnosis not present

## 2020-10-18 DIAGNOSIS — K118 Other diseases of salivary glands: Secondary | ICD-10-CM

## 2020-10-18 DIAGNOSIS — Z7982 Long term (current) use of aspirin: Secondary | ICD-10-CM | POA: Diagnosis not present

## 2020-10-18 DIAGNOSIS — Z79899 Other long term (current) drug therapy: Secondary | ICD-10-CM | POA: Insufficient documentation

## 2020-10-18 LAB — COMPREHENSIVE METABOLIC PANEL
ALT: 27 U/L (ref 0–44)
AST: 24 U/L (ref 15–41)
Albumin: 3.7 g/dL (ref 3.5–5.0)
Alkaline Phosphatase: 69 U/L (ref 38–126)
Anion gap: 11 (ref 5–15)
BUN: 13 mg/dL (ref 8–23)
CO2: 25 mmol/L (ref 22–32)
Calcium: 9.1 mg/dL (ref 8.9–10.3)
Chloride: 105 mmol/L (ref 98–111)
Creatinine, Ser: 0.99 mg/dL (ref 0.61–1.24)
GFR, Estimated: >60 mL/min (ref >60)
Glucose, Bld: 109 mg/dL — ABNORMAL HIGH (ref 70–99)
Potassium: 3.8 mmol/L (ref 3.5–5.1)
Sodium: 141 mmol/L (ref 135–145)
Total Bilirubin: 1 mg/dL (ref 0.3–1.2)
Total Protein: 6 g/dL — ABNORMAL LOW (ref 6.5–8.1)

## 2020-10-18 LAB — CBC WITH DIFFERENTIAL/PLATELET
Abs Immature Granulocytes: 0.04 10*3/uL (ref 0.00–0.07)
Basophils Absolute: 0.1 10*3/uL (ref 0.0–0.1)
Basophils Relative: 1 %
Eosinophils Absolute: 0.2 10*3/uL (ref 0.0–0.5)
Eosinophils Relative: 2 %
HCT: 51 % (ref 39.0–52.0)
Hemoglobin: 17.1 g/dL — ABNORMAL HIGH (ref 13.0–17.0)
Immature Granulocytes: 1 %
Lymphocytes Relative: 20 %
Lymphs Abs: 1.6 10*3/uL (ref 0.7–4.0)
MCH: 30.6 pg (ref 26.0–34.0)
MCHC: 33.5 g/dL (ref 30.0–36.0)
MCV: 91.2 fL (ref 80.0–100.0)
Monocytes Absolute: 1.1 10*3/uL — ABNORMAL HIGH (ref 0.1–1.0)
Monocytes Relative: 14 %
Neutro Abs: 4.8 10*3/uL (ref 1.7–7.7)
Neutrophils Relative %: 62 %
Platelets: 186 10*3/uL (ref 150–400)
RBC: 5.59 MIL/uL (ref 4.22–5.81)
RDW: 12.6 % (ref 11.5–15.5)
WBC: 7.7 10*3/uL (ref 4.0–10.5)
nRBC: 0 % (ref 0.0–0.2)

## 2020-10-18 MED ORDER — DIAZEPAM 5 MG PO TABS: 5.0000 mg | ORAL_TABLET | Freq: Four times a day (QID) | ORAL | 0 refills | Status: DC | PRN

## 2020-10-18 MED ORDER — IOHEXOL 300 MG/ML  SOLN
75.0000 mL | Freq: Once | INTRAMUSCULAR | Status: AC | PRN
  Administered 2020-10-18: 75 mL via INTRAVENOUS

## 2020-10-18 NOTE — ED Triage Notes (Signed)
Pt said he use to have panic attack and he was on medication and then tonight he felt like his skin was crawling and his muscles were tightening. Pt said he just felt like he was very anxious. Pt was on medications but has not been taking any since 2015.

## 2020-10-18 NOTE — ED Provider Notes (Signed)
[place] EMERGENCY DEPARTMENT Provider Note   CSN: [id number] Arrival date & time: 10/18/20  0049     History Chief Complaint  Patient presents with  . Panic Attack    Glenn Lane is a 64 y.o. male.  Patient presents to the emergency department stating that he thinks he has been having a panic attack for the last 3 days.  He has felt very anxious, has not been able to sleep.  He reports that he feels like his skin is crawling and all of his muscles are cramping.  He reports that he had problems with anxiety and panic disorder approximately 10 years ago when felt similarly.  At that time he was treated with Zoloft and Valium for several years but then did not need it anymore.  He reports that he just retired 3 weeks ago and thinks that this is adding to his anxiousness.  Patient also complaining of a lump on the left side of his neck.  He reports that it has been there for about a year but seems to be enlarging.  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  His brother has a history of head and neck cancer.        Past Medical History:  Diagnosis Date  . Diabetes mellitus   . Hypertension     Patient Active Problem List   Diagnosis Date Noted  . Branch retinal vein occlusion of right eye with macular edema 05/23/2020  . Essential hypertension 02/11/2018  . Hyperlipidemia 02/11/2018    No past surgical history on file.     Family History  Problem Relation Age of Onset  . Heart failure Mother   . Heart failure Father   . Hyperlipidemia Father     Social History   Tobacco Use  . Smoking status: Never Smoker  . Smokeless tobacco: Never Used  Substance Use Topics  . Alcohol use: Yes    Comment: history of.  . Drug use: No    Home Medications Prior to Admission medications   Medication Sig Start Date End Date Taking? Authorizing Provider  Ascorbic Acid (VITAMIN C) 1000 MG tablet Take  2,000 mg by mouth daily.    [provider]  aspirin (EC-81 ASPIRIN) 81 MG EC tablet Take 1 tablet (81 mg total) by mouth daily. Swallow whole. 05/01/20   Nicolette Bang, DO  atorvastatin (LIPITOR) 40 MG tablet Take 1 tablet (40 mg total) by mouth daily. 04/23/20   Nicolette Bang, DO  benzonatate (TESSALON) 100 MG capsule Take 1 capsule (100 mg total) by mouth 2 (two) times daily as needed for cough. 05/23/20   Nicolette Bang, DO  diazepam (VALIUM) 5 MG tablet Take 1 tablet (5 mg total) by mouth every 6 (six) hours as needed for anxiety (spasms). 10/18/20   Orpah Greek, MD  hydrochlorothiazide (HYDRODIURIL) 12.5 MG tablet Take 1 tablet by mouth daily.    [provider]  lisinopril-hydrochlorothiazide (ZESTORETIC) 20-12.5 MG tablet Take 1 tablet by mouth daily. 04/23/20   Nicolette Bang, DO  Multiple Vitamins-Minerals (CENTRUM SILVER PO) Take 1 tablet by mouth daily.    [provider]    Allergies    Avelox [moxifloxacin hcl in nacl], Alprazolam, and Avelox  [moxifloxacin hcl]  Review of Systems   Review of Systems  Musculoskeletal: Positive for myalgias.  Hematological: Positive for adenopathy.  Psychiatric/Behavioral: Positive for sleep disturbance. The patient  is nervous/anxious.   All other systems reviewed and are negative.   Physical Exam Updated Vital Signs BP (!) 127/4 (BP Location: Right Arm)   Pulse 89   Temp 98.4 F (36.9 C) (Oral)   Resp 20   SpO2 98%   Physical Exam Vitals and nursing note reviewed.  Constitutional:      General: He is not in acute distress.    Appearance: Normal appearance. He is well-developed.  HENT:     Head: Normocephalic and atraumatic.     Right Ear: Hearing normal.     Left Ear: Hearing normal.     Nose: Nose normal.  Eyes:     Conjunctiva/sclera: Conjunctivae normal.     Pupils: Pupils are equal, round, and reactive to light.  Cardiovascular:     Rate and  Rhythm: Regular rhythm.     Heart sounds: S1 normal and S2 normal. No murmur heard.  No friction rub. No gallop.   Pulmonary:     Effort: Pulmonary effort is normal. No respiratory distress.     Breath sounds: Normal breath sounds.  Chest:     Chest wall: No tenderness.  Abdominal:     General: Bowel sounds are normal.     Palpations: Abdomen is soft.     Tenderness: There is no abdominal tenderness. There is no guarding or rebound. Negative signs include Murphy's sign and McBurney's sign.     Hernia: No hernia is present.  Musculoskeletal:        General: Normal range of motion.     Cervical back: Normal range of motion and neck supple.  Lymphadenopathy:     Cervical: Cervical adenopathy present.     Left cervical: Superficial cervical adenopathy present.  Skin:    General: Skin is warm and dry.     Findings: No rash.  Neurological:     Mental Status: He is alert and oriented to person, place, and time.     GCS: GCS eye subscore is 4. GCS verbal subscore is 5. GCS motor subscore is 6.     Cranial Nerves: No cranial nerve deficit.     Sensory: No sensory deficit.     Coordination: Coordination normal.  Psychiatric:        Speech: Speech normal.        Behavior: Behavior normal.        Thought Content: Thought content normal.     ED Results / Procedures / Treatments   Labs (all labs ordered are listed, but only abnormal results are displayed) Labs Reviewed  CBC WITH DIFFERENTIAL/PLATELET - Abnormal; Notable for the following components:      Result Value   Hemoglobin 17.1 (*)    Monocytes Absolute 1.1 (*)    All other components within normal limits  COMPREHENSIVE METABOLIC PANEL - Abnormal; Notable for the following components:   Glucose, Bld 109 (*)    Total Protein 6.0 (*)    All other components within normal limits    EKG EKG Interpretation  Date/Time:  Friday October 18 2020 07:29:02 EDT Ventricular Rate:  65 PR Interval:  216 QRS Duration: 76 QT  Interval:  414 QTC Calculation: 430 R Axis:   20 Text Interpretation: Sinus rhythm with 1st degree A-V block Otherwise normal ECG Confirmed by Ripley Fraise [phone]) on 10/19/2020 9:15:43 AM   Radiology CT Soft Tissue Neck W Contrast  Result Date: 10/18/2020 CLINICAL DATA:  Neck mass, initial workup. Lump inferior to the left ear for 3 years EXAM:  CT NECK WITH CONTRAST TECHNIQUE: Multidetector CT imaging of the neck was performed using the standard protocol following the bolus administration of intravenous contrast. CONTRAST:  34mL OMNIPAQUE IOHEXOL 300 MG/ML  SOLN COMPARISON:  None. FINDINGS: Pharynx and larynx: No evidence of mass or swelling Salivary glands: Subtle but definite mass in the superficial left parotid deep to the palpable marker, measuring 17 mm in craniocaudal span. There are no distinguishing features. No second mass is seen. No inflammation or stone. Thyroid: Normal. Lymph nodes: None enlarged or abnormal density. Vascular: Unremarkable. Limited intracranial: Negative Visualized orbits: Negative Mastoids and visualized paranasal sinuses: Clear Skeleton: Disc degeneration especially at C3-4 where there is left foraminal impingement from disc height loss and uncovertebral spurring. Upper chest: Negative IMPRESSION: 1. 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 No diagnostic features to the mass, recommend ENT referral. 2. Negative for lymphadenopathy. Electronically Signed   By: Monte Fantasia M.D.   On: 10/18/2020 07:45    Procedures Procedures (including critical care time)  Medications Ordered in ED Medications  iohexol (OMNIPAQUE) 300 MG/ML solution 75 mL (75 mLs Intravenous Contrast Given 10/18/20 3491)    ED Course  I have reviewed the triage vital signs and the nursing notes.  Pertinent labs & imaging results that were available during my care of the patient were reviewed by me and considered in my medical decision making  (see chart for details).    MDM Rules/Calculators/A&P                          Patient presents to the emergency department with multiple complaints.  Patient is here primarily with concerns over anxiety.  Patient has a history of anxiety and panic attacks but has not had any problems in years.  He retired 3 weeks ago and has been having increased anxiety ever since.  He reports feeling severely anxious, unable to sleep.  Basic labs are unremarkable.  CT neck pending. Will sign out to oncoming ER physician to follow up - anticipate discharge with follow up.   Final Clinical Impression(s) / ED Diagnoses Final diagnoses:  Anxiety disorder, unspecified type  Parotid mass    Rx / DC Orders ED Discharge Orders         Ordered    diazepam (VALIUM) 5 MG tablet  Every 6 hours PRN        10/18/20 0715           Orpah Greek, MD 10/20/20 [phone]

## 2020-10-18 NOTE — ED Provider Notes (Signed)
  Physical Exam  BP (!) 127/4 (BP Location: Right Arm)   Pulse 89   Temp 98.1 F (36.7 C) (Oral)   Resp 20   SpO2 98%   Physical Exam  ED Course/Procedures     Procedures  MDM  Received patient in signout.  CT done and showed parotid mass on left side.  Discussed with patient results.  Included telling him potentially could be malignancy.  Will have follow-up with ear nose and throat for further evaluation of this.  Patient has had a brother with head and neck cancer.  Labs also reassuring.  Discharged with Valium with anxiety.  States he has a new PCP appointment on Tuesday.       Davonna Belling, MD 10/18/20 [phone]

## 2020-10-18 NOTE — Discharge Instructions (Addendum)
Follow-up with ear nose and throat to further evaluate the mass in your parotid gland.

## 2020-11-12 ENCOUNTER — Encounter (HOSPITAL_COMMUNITY): Payer: Self-pay

## 2020-11-12 ENCOUNTER — Encounter: Payer: Self-pay | Admitting: Family Medicine

## 2020-11-12 ENCOUNTER — Other Ambulatory Visit: Payer: Self-pay

## 2020-11-12 ENCOUNTER — Emergency Department (HOSPITAL_COMMUNITY)
Admission: EM | Admit: 2020-11-12 | Discharge: 2020-11-12 | Disposition: A | Discharge status: Home / Self Care | Payer: Medicare HMO | Attending: Emergency Medicine | Admitting: Emergency Medicine

## 2020-11-12 ENCOUNTER — Emergency Department (HOSPITAL_COMMUNITY): Payer: Medicare HMO

## 2020-11-12 ENCOUNTER — Ambulatory Visit (INDEPENDENT_AMBULATORY_CARE_PROVIDER_SITE_OTHER)
Admission: EM | Admit: 2020-11-12 | Discharge: 2020-11-12 | Disposition: A | Discharge status: Home / Self Care | Payer: Medicare HMO | Source: Home / Self Care

## 2020-11-12 DIAGNOSIS — R0789 Other chest pain: Secondary | ICD-10-CM | POA: Insufficient documentation

## 2020-11-12 DIAGNOSIS — R0602 Shortness of breath: Secondary | ICD-10-CM | POA: Insufficient documentation

## 2020-11-12 DIAGNOSIS — I10 Essential (primary) hypertension: Secondary | ICD-10-CM | POA: Diagnosis not present

## 2020-11-12 DIAGNOSIS — E119 Type 2 diabetes mellitus without complications: Secondary | ICD-10-CM | POA: Insufficient documentation

## 2020-11-12 DIAGNOSIS — Z20822 Contact with and (suspected) exposure to covid-19: Secondary | ICD-10-CM | POA: Insufficient documentation

## 2020-11-12 DIAGNOSIS — R0902 Hypoxemia: Secondary | ICD-10-CM

## 2020-11-12 DIAGNOSIS — Z7982 Long term (current) use of aspirin: Secondary | ICD-10-CM | POA: Diagnosis not present

## 2020-11-12 DIAGNOSIS — Z79899 Other long term (current) drug therapy: Secondary | ICD-10-CM | POA: Diagnosis not present

## 2020-11-12 LAB — CBC
HCT: 54 % — ABNORMAL HIGH (ref 39.0–52.0)
Hemoglobin: 18.2 g/dL — ABNORMAL HIGH (ref 13.0–17.0)
MCH: 30.8 pg (ref 26.0–34.0)
MCHC: 33.7 g/dL (ref 30.0–36.0)
MCV: 91.4 fL (ref 80.0–100.0)
Platelets: 203 10*3/uL (ref 150–400)
RBC: 5.91 MIL/uL — ABNORMAL HIGH (ref 4.22–5.81)
RDW: 12.1 % (ref 11.5–15.5)
WBC: 7.5 10*3/uL (ref 4.0–10.5)
nRBC: 0 % (ref 0.0–0.2)

## 2020-11-12 LAB — RESPIRATORY PANEL BY RT PCR (FLU A&B, COVID)
Influenza A by PCR: NEGATIVE
Influenza B by PCR: NEGATIVE
SARS Coronavirus 2 by RT PCR: NEGATIVE

## 2020-11-12 LAB — BASIC METABOLIC PANEL
Anion gap: 11 (ref 5–15)
BUN: 17 mg/dL (ref 8–23)
CO2: 25 mmol/L (ref 22–32)
Calcium: 9.6 mg/dL (ref 8.9–10.3)
Chloride: 101 mmol/L (ref 98–111)
Creatinine, Ser: 1.1 mg/dL (ref 0.61–1.24)
GFR, Estimated: >60 mL/min (ref >60)
Glucose, Bld: 130 mg/dL — ABNORMAL HIGH (ref 70–99)
Potassium: 4.3 mmol/L (ref 3.5–5.1)
Sodium: 137 mmol/L (ref 135–145)

## 2020-11-12 LAB — TROPONIN I (HIGH SENSITIVITY): Troponin I (High Sensitivity): 2 ng/L (ref <18)

## 2020-11-12 MED ORDER — ALBUTEROL SULFATE HFA 108 (90 BASE) MCG/ACT IN AERS
2.0000 | INHALATION_SPRAY | Freq: Once | RESPIRATORY_TRACT | Status: AC
  Administered 2020-11-12: 2 via RESPIRATORY_TRACT
  Filled 2020-11-12: qty 6.7

## 2020-11-12 MED ORDER — DEXAMETHASONE SODIUM PHOSPHATE 10 MG/ML IJ SOLN
10.0000 mg | Freq: Once | INTRAMUSCULAR | Status: AC
  Administered 2020-11-12: 10 mg via INTRAVENOUS
  Filled 2020-11-12: qty 1

## 2020-11-12 NOTE — Discharge Instructions (Signed)
Your evaluation today was reassuring, Suspect that the symptoms you are having knee pain caused by EMG with edema which can be caused by certain medications such as lisinopril and can happen even if you have been on them for a long time.  Please discontinue this medication.  Your oxygen levels have looked good here and we do not see signs of a problem with your heart or lungs causing your symptoms today.  You were given steroids which should continue to help over the next few days, you may use your inhaler as needed.  Please call to schedule close follow-up with your PCP for other medications for blood pressure management.

## 2020-11-12 NOTE — Discharge Instructions (Addendum)
Patient was transferred urgently

## 2020-11-12 NOTE — ED Triage Notes (Signed)
Pt from UC with ems for further evaluation of sob that suddenly woke him out of his sleep this afternoon during a nap. He felt like his throat was closing and he was gasping for air. Pt also reports dizziness with ambulation. Resp e.u at this time. 90-92% on room air. 2L [place] applied. Pt recently started on amitriptyline for anxiety.

## 2020-11-12 NOTE — ED Provider Notes (Signed)
[facility]    CSN: [id number] Arrival date & time: 11/12/20  1443      History   Chief Complaint Chief Complaint  Patient presents with  . Shortness of Breath    HPI Glenn Lane is a 64 y.o. male.   64 yo established [facility] patient  Pt states when he woke up today around 2pm he was gasping for air, felt like his windpipe was closed. States his PCP changes his Lipitor yesterday from 40mg  to 80mg . Pt states still feels SOB with pressure to center upper chest and having dizziness. Pt in no distress, speaking in complete sentences.   CAD risk factors:  Hypertension, hyperlipidemia.  (also has h/o retinal vein occlusion right eye)  Patient complains of deep heaviness in his chest.  No h/o MI.  No leg pain.  No cough.  Patient was going to take his inhaler but found that it was empty.     Past Medical History:  Diagnosis Date  . Diabetes mellitus   . Hypertension     Patient Active Problem List   Diagnosis Date Noted  . Branch retinal vein occlusion of right eye with macular edema 05/23/2020  . Essential hypertension 02/11/2018  . Hyperlipidemia 02/11/2018    History reviewed. No pertinent surgical history.     Home Medications    Prior to Admission medications   Medication Sig Start Date End Date Taking? Authorizing Provider  Ascorbic Acid (VITAMIN C) 1000 MG tablet Take 2,000 mg by mouth daily.    [provider]  aspirin (EC-81 ASPIRIN) 81 MG EC tablet Take 1 tablet (81 mg total) by mouth daily. Swallow whole. 05/01/20   Nicolette Bang, DO  atorvastatin (LIPITOR) 40 MG tablet Take 1 tablet (40 mg total) by mouth daily. Patient taking differently: Take 80 mg by mouth daily.  04/23/20   Nicolette Bang, DO  diazepam (VALIUM) 5 MG tablet Take 1 tablet (5 mg total) by mouth every 6 (six) hours as needed for anxiety (spasms). 10/18/20   Orpah Greek, MD  hydrochlorothiazide (HYDRODIURIL) 12.5 MG tablet Take 1 tablet by  mouth daily.    [provider]  lisinopril-hydrochlorothiazide (ZESTORETIC) 20-12.5 MG tablet Take 1 tablet by mouth daily. 04/23/20   Nicolette Bang, DO  Multiple Vitamins-Minerals (CENTRUM SILVER PO) Take 1 tablet by mouth daily.    [provider]    Family History Family History  Problem Relation Age of Onset  . Heart failure Mother   . Heart failure Father   . Hyperlipidemia Father     Social History Social History   Tobacco Use  . Smoking status: Never Smoker  . Smokeless tobacco: Never Used  Substance Use Topics  . Alcohol use: Yes    Comment: history of.  . Drug use: No     Allergies   Avelox [moxifloxacin hcl in nacl], Alprazolam, and Avelox  [moxifloxacin hcl]   Review of Systems Review of Systems   Physical Exam Triage Vital Signs ED Triage Vitals  Enc Vitals Group     BP      Pulse      Resp      Temp      Temp src      SpO2      Weight      Height      Head Circumference      Peak Flow      Pain Score      Pain Loc  Pain Edu?      Excl. in [place]?    No data found.  Updated Vital Signs BP (!) 146/96 (BP Location: Left Arm)   Pulse 94   Temp 97.8 F (36.6 C) (Oral)   Resp 18   SpO2 95%    Physical Exam Vitals and nursing note reviewed.  Constitutional:      General: He is in acute distress.     Appearance: He is well-developed. He is obese. He is diaphoretic. He is not ill-appearing.  HENT:     Head: Normocephalic.  Cardiovascular:     Rate and Rhythm: Normal rate and regular rhythm.     Heart sounds: Normal heart sounds.  Pulmonary:     Effort: Accessory muscle usage and respiratory distress present.     Breath sounds: Examination of the left-upper field reveals wheezing. Wheezing present.  Chest:     Chest wall: No deformity.  Musculoskeletal:     Cervical back: Normal range of motion and neck supple.  Neurological:     Mental Status: He is alert.    This patient was examined,.  Pulse  oximetry dropped to 88% intermittently.  EMS was contacted for emergent transfer to hospital because of the shortness of breath, chest pain, and hypoxia.  UC Treatments / Results  Labs (all labs ordered are listed, but only abnormal results are displayed) Labs Reviewed - No data to display  EKG Nonspecific ST-T wave changes were seen in the 12-lead EKG anterior leads only.  Radiology No results found.  Procedures Intravenous line established with 20-gauge needle  Medications Ordered in UC Medications - No data to display  Initial Impression / Assessment and Plan / UC Course  I have reviewed the triage vital signs and the nursing notes.  Pertinent labs & imaging results that were available during my care of the patient were reviewed by me and considered in my medical decision making (see chart for details).    Final Clinical Impressions(s) / UC Diagnoses   Final diagnoses:  Shortness of breath  Chest tightness  Hypoxia     Discharge Instructions     Patient was transferred urgently    ED Prescriptions    None     I have reviewed the PDMP during this encounter.   Robyn Haber, MD 11/12/20 [phone]

## 2020-11-12 NOTE — ED Triage Notes (Signed)
Pt states when he woke up today around 2pm he was gasping for air, felt like his windpipe was closed. States his PCP changes his Lipitor yesterday from 40mg  to 80mg . Pt states still feels SOB with pressure to center upper chest and having dizziness. Pt in no distress, speaking in complete sentences.

## 2020-11-12 NOTE — ED Notes (Signed)
Patient verbalizes understanding of discharge instructions. Opportunity for questioning and answers were provided. Armband removed by staff, pt discharged from ED. Pt. ambulatory and discharged home.  

## 2020-11-12 NOTE — ED Provider Notes (Signed)
[facility] Provider Note   CSN: [id number] Arrival date & time: 11/12/20  1604     History Chief Complaint  Patient presents with  . Shortness of Breath    Glenn Lane is a 64 y.o. male.  Glenn Lane is a 64 y.o. male with a history of hypertension, hyperlipidemia and COPD, who presents from urgent care for evaluation of shortness of breath.  Patient reports around 2 PM he awoke up suddenly from a nap feeling like he could not breathe.  He states it felt like his neck was swollen and his throat was closing up.  He reports that he thinks that his neck physically looked swollen but did not note facial or lip swelling.  He states that the symptoms lasted about 10 or 15 minutes and felt like he could not breathe at all.  He took a shower and states that the warm air seemed to help with his symptoms.  He realized that he was out of his inhaler when he tried to use this.  He reports some chest pressure and tightness when the symptoms occurred.  No lightheadedness or syncope.  No lower extremity swelling.  History of COPD but no history of home oxygen requirement, has not smoked in 14 years.  No nausea or vomiting.  Does report feeling a bit dizzy when symptoms first began but this is resolved.  Patient initially presented to urgent care, and while they evaluated him there they were concerned with his symptoms and that he had had some transient hypoxia with O2 sats at 88 intermittently and so sent him via EMS to the ED for further evaluation.  He did not receive any medications or treatments there prior to arrival.  Had an EKG done that did not show acute ischemic changes.  Denies any new foods or household products.  Reports that his Lipitor dose was increased from 40 mg to 80 mg yesterday, started Elavil a week ago but no other new medications.  Does take lisinopril. No hives or other rashes.        Past Medical History:  Diagnosis Date  . Diabetes  mellitus   . Hypertension     Patient Active Problem List   Diagnosis Date Noted  . Branch retinal vein occlusion of right eye with macular edema 05/23/2020  . Essential hypertension 02/11/2018  . Hyperlipidemia 02/11/2018    History reviewed. No pertinent surgical history.     Family History  Problem Relation Age of Onset  . Heart failure Mother   . Heart failure Father   . Hyperlipidemia Father     Social History   Tobacco Use  . Smoking status: Never Smoker  . Smokeless tobacco: Never Used  Substance Use Topics  . Alcohol use: Yes    Comment: history of.  . Drug use: No    Home Medications Prior to Admission medications   Medication Sig Start Date End Date Taking? Authorizing Provider  Ascorbic Acid (VITAMIN C) 1000 MG tablet Take 2,000 mg by mouth daily.    [provider]  aspirin (EC-81 ASPIRIN) 81 MG EC tablet Take 1 tablet (81 mg total) by mouth daily. Swallow whole. 05/01/20   Nicolette Bang, DO  atorvastatin (LIPITOR) 40 MG tablet Take 1 tablet (40 mg total) by mouth daily. Patient taking differently: Take 80 mg by mouth daily.  04/23/20   Nicolette Bang, DO  diazepam (VALIUM) 5 MG tablet Take 1 tablet (5 mg total) by mouth  every 6 (six) hours as needed for anxiety (spasms). 10/18/20   Orpah Greek, MD  hydrochlorothiazide (HYDRODIURIL) 12.5 MG tablet Take 1 tablet by mouth daily.    [provider]  lisinopril-hydrochlorothiazide (ZESTORETIC) 20-12.5 MG tablet Take 1 tablet by mouth daily. 04/23/20   Nicolette Bang, DO  Multiple Vitamins-Minerals (CENTRUM SILVER PO) Take 1 tablet by mouth daily.    [provider]    Allergies    Avelox [moxifloxacin hcl in nacl], Alprazolam, and Avelox  [moxifloxacin hcl]  Review of Systems   Review of Systems  Constitutional: Negative for chills and fever.  HENT: Negative for congestion, facial swelling, sore throat and trouble swallowing.     Respiratory: Positive for chest tightness and shortness of breath. Negative for cough and wheezing.   Cardiovascular: Negative for chest pain.  Gastrointestinal: Negative for abdominal pain, nausea and vomiting.  Genitourinary: Negative for dysuria and frequency.  Musculoskeletal: Negative for arthralgias and myalgias.  Skin: Negative for color change and rash.  Neurological: Negative for dizziness, syncope, weakness and light-headedness.  All other systems reviewed and are negative.   Physical Exam Updated Vital Signs BP (!) 153/97 (BP Location: Right Arm)   Pulse 87   Temp 97.7 F (36.5 C) (Oral)   Resp 18   SpO2 94%   Physical Exam Vitals and nursing note reviewed.  Constitutional:      General: He is not in acute distress.    Appearance: He is well-developed. He is obese. He is not diaphoretic.     Comments: Alert, well-appearing and in no acute distress  HENT:     Head: Normocephalic and atraumatic.     Mouth/Throat:     Mouth: Mucous membranes are moist.     Pharynx: Oropharynx is clear. No pharyngeal swelling.     Comments: Oropharynx is clear without erythema or edema, patient tolerating secretions, normal phonation Eyes:     General:        Right eye: No discharge.        Left eye: No discharge.     Pupils: Pupils are equal, round, and reactive to light.  Neck:     Trachea: No tracheal deviation.     Comments: Supple, normal range of motion, no palpable swelling or lymphadenopathy, no stridor, trachea midline Cardiovascular:     Rate and Rhythm: Normal rate and regular rhythm.     Heart sounds: Normal heart sounds. No murmur heard.  No friction rub. No gallop.   Pulmonary:     Effort: Pulmonary effort is normal. No respiratory distress.     Breath sounds: Normal breath sounds. No wheezing or rales.     Comments: Patient on 2 L nasal cannula on arrival, respirations are equal and unlabored, patient is able to speak in full sentences, O2 sats at 94%, no  respiratory distress, on auscultation lungs are clear without wheezes, rales or rhonchi. Chest:     Chest wall: No tenderness.  Abdominal:     General: Bowel sounds are normal. There is no distension.     Palpations: Abdomen is soft. There is no mass.     Tenderness: There is no abdominal tenderness. There is no guarding.     Comments: Abdomen soft, nondistended, nontender to palpation in all quadrants without guarding or peritoneal signs  Musculoskeletal:        General: No deformity.     Cervical back: Neck supple.     Right lower leg: No tenderness. No edema.  Left lower leg: No tenderness. No edema.  Skin:    General: Skin is warm and dry.     Capillary Refill: Capillary refill takes less than 2 seconds.  Neurological:     Mental Status: He is alert.     Coordination: Coordination normal.     Comments: Speech is clear, able to follow commands Moves extremities without ataxia, coordination intact  Psychiatric:        Mood and Affect: Mood normal.        Behavior: Behavior normal.     ED Results / Procedures / Treatments   Labs (all labs ordered are listed, but only abnormal results are displayed) Labs Reviewed  BASIC METABOLIC PANEL - Abnormal; Notable for the following components:      Result Value   Glucose, Bld 130 (*)    All other components within normal limits  CBC - Abnormal; Notable for the following components:   RBC 5.91 (*)    Hemoglobin 18.2 (*)    HCT 54.0 (*)    All other components within normal limits  RESPIRATORY PANEL BY RT PCR (FLU A&B, COVID)  TROPONIN I (HIGH SENSITIVITY)    EKG None  Radiology DG Chest 2 View  Result Date: 11/12/2020 CLINICAL DATA:  64 year old male with shortness of breath EXAM: CHEST - 2 VIEW COMPARISON:  04/08/2017 FINDINGS: Cardiomediastinal silhouette unchanged in size and contour. No evidence of central vascular congestion. No interlobular septal thickening. Coarsened interstitial markings throughout. No  pneumothorax or pleural effusion. No confluent airspace disease. Stigmata of emphysema, with increased retrosternal airspace, flattened hemidiaphragms, increased AP diameter, and hyperinflation on the AP view. Degenerative changes of the spine without acute displaced fracture IMPRESSION: Chronic lung changes and evidence of emphysema without acute cardiopulmonary disease Electronically Signed   By: Corrie Mckusick D.O.   On: 11/12/2020 16:38    Procedures Procedures (including critical care time)  Medications Ordered in ED Medications  dexamethasone (DECADRON) injection 10 mg (10 mg Intravenous Given 11/12/20 2106)  albuterol (VENTOLIN HFA) 108 (90 Base) MCG/ACT inhaler 2 puff (2 puffs Inhalation Given 11/12/20 2107)    ED Course  I have reviewed the triage vital signs and the nursing notes.  Pertinent labs & imaging results that were available during my care of the patient were reviewed by me and considered in my medical decision making (see chart for details).    MDM Rules/Calculators/A&P                         23 male presents from urgent care for evaluation of sudden onset episode of shortness of breath with a sensation that his throat was closing.  States that he felt like he could not breathe for about 10 or 15 minutes, took a shower and symptoms seem to be improving.  Reports that he felt like his neck was swollen but did not see facial swelling.  Has never had similar symptoms, has known COPD but this does not feel similar.  Went to urgent care, was noted to have some transient hypoxia with O2 sats of 88%, was placed on 2 L and sent emergently for further evaluation.  EKG at urgent care with sinus rhythm without ischemic changes.  Patient states since arriving here his symptoms have continued to improve and he feels like his breathing is back at baseline now.  He does take lisinopril, no known history of angioedema.  Had a medication dose increase but no other significant medication  changes over the past few days, no new foods or household products.  No history of anaphylaxis.  No lower extremity edema or signs of fluid overload, did have some chest tightness when symptoms began but this is resolved, no syncope.  No continued chest pain or dyspnea on exertion.  No history of blood clots.  And I have very low suspicion for this given that symptoms seem to be spontaneously resolving.  Question whether this could have been angioedema in the setting of lisinopril use, but reassured that this has rapidly improved.  Will check basic labs, troponin and chest x-ray.  Case discussed with Dr. Ralene Bathe who is seen and evaluated patient as well agrees that this was likely angioedema, recommends giving dose of Decadron as well as per providing patient with inhaler.  As long as lab evaluation is reassuring patient can likely be discharged home.  Patient's chest x-ray shows signs of chronic emphysema and COPD, but no acute cardiopulmonary disease.  Patient without leukocytosis, hemoglobin is elevated but this is chronic and unchanged, glucose of 130 but no other significant electrolyte derangements, normal renal function.  Troponin is not elevated, symptoms began over 5 hours ago, do not feel that delta troponin is indicated given no return of symptoms.  Covid and flu tests are negative.  Patient ambulated in the department with no episodes of hypoxia, his work-up here has been very reassuring and he has had no return of symptoms.  At this time feel he is stable for discharge home with close PCP follow-up and return precautions discussed.  We will have him discontinue his lisinopril and discuss further blood pressure management with his PCP.  Final Clinical Impression(s) / ED Diagnoses Final diagnoses:  Shortness of breath    Rx / DC Orders ED Discharge Orders    None       Janet Berlin 11/13/20 Earleen Newport, MD 11/16/20 1827

## 2020-11-12 NOTE — ED Notes (Signed)
Pt's pulse ox sat 93% or greater on RA while ambulating. Pt had no complaints.

## 2020-12-30 ENCOUNTER — Other Ambulatory Visit: Payer: Self-pay | Admitting: Otolaryngology

## 2021-03-04 ENCOUNTER — Encounter (HOSPITAL_COMMUNITY): Payer: Self-pay | Admitting: Certified Registered Nurse Anesthetist

## 2021-03-04 NOTE — Progress Notes (Signed)
Someone in Pre- surgery has called Glenn Lane. Glenn Lane for 7 days- voice mail is all we get.  We h ave left numerous messages asking patient to return our calls. Today I left a message on Glenn Lane, Glenn Lane's emergency contact, a Glenn Lane,   Asking him to call us with another number or have patient call me. I call Dr. Velvet Bathe office and left a message for the scheduler , informing of the unsuccessful calls we have made to patient with no return calls.

## 2021-03-05 ENCOUNTER — Encounter (HOSPITAL_COMMUNITY): Admission: RE | Payer: Self-pay | Source: Home / Self Care

## 2021-03-05 ENCOUNTER — Ambulatory Visit (HOSPITAL_COMMUNITY): Admission: RE | Admit: 2021-03-05 | Payer: Medicare HMO | Source: Home / Self Care | Admitting: Otolaryngology

## 2021-03-05 SURGERY — EXCISION, PAROTID GLAND
Anesthesia: General | Laterality: Left

## 2021-11-24 IMAGING — CR DG CHEST 2V
2 series · 2 of 2 positions shown · non-contrast
Comparison: 04/08/2017

CLINICAL DATA: 64-year-old male with shortness of breath

EXAM:
CHEST - 2 VIEW

[chest pa]
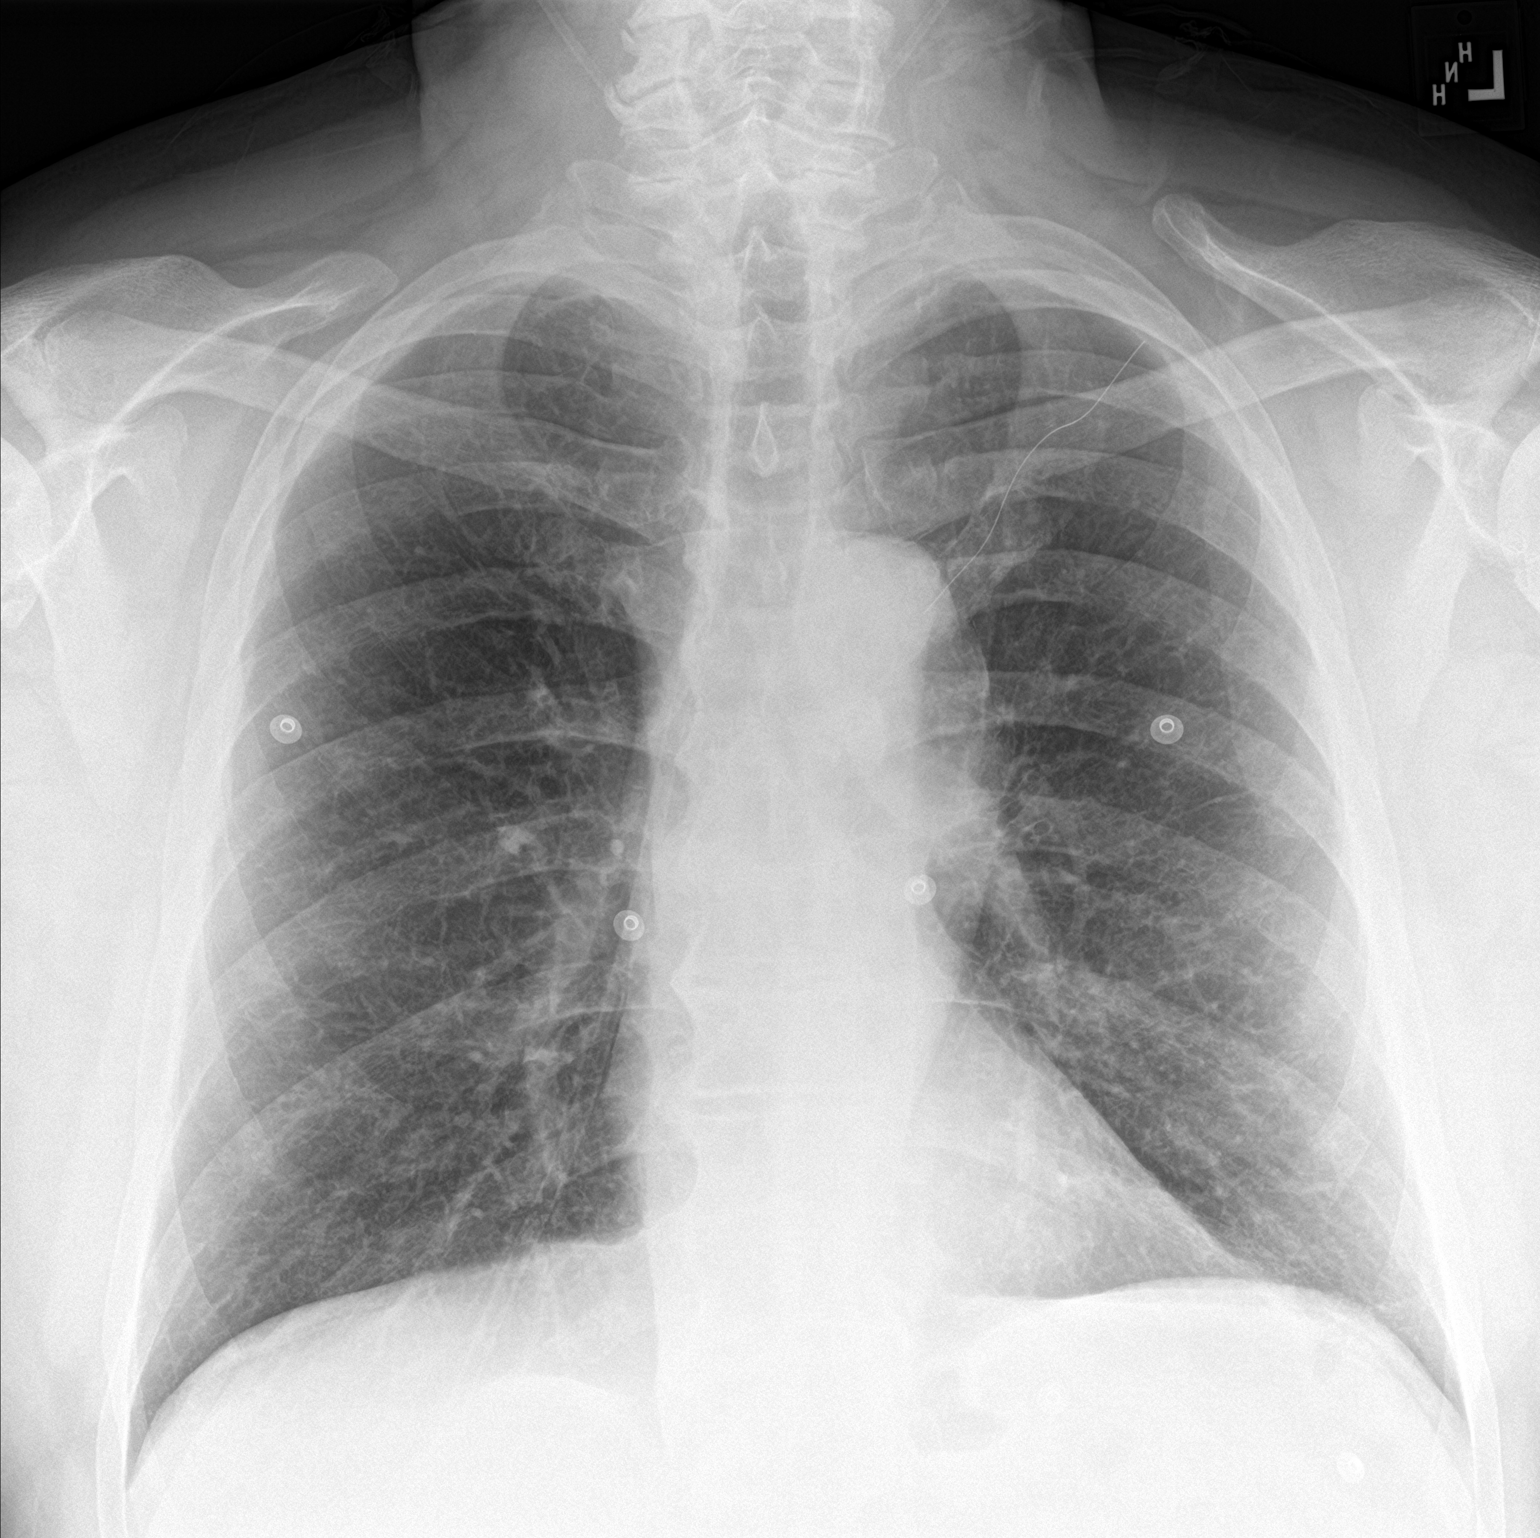

[chest lat]
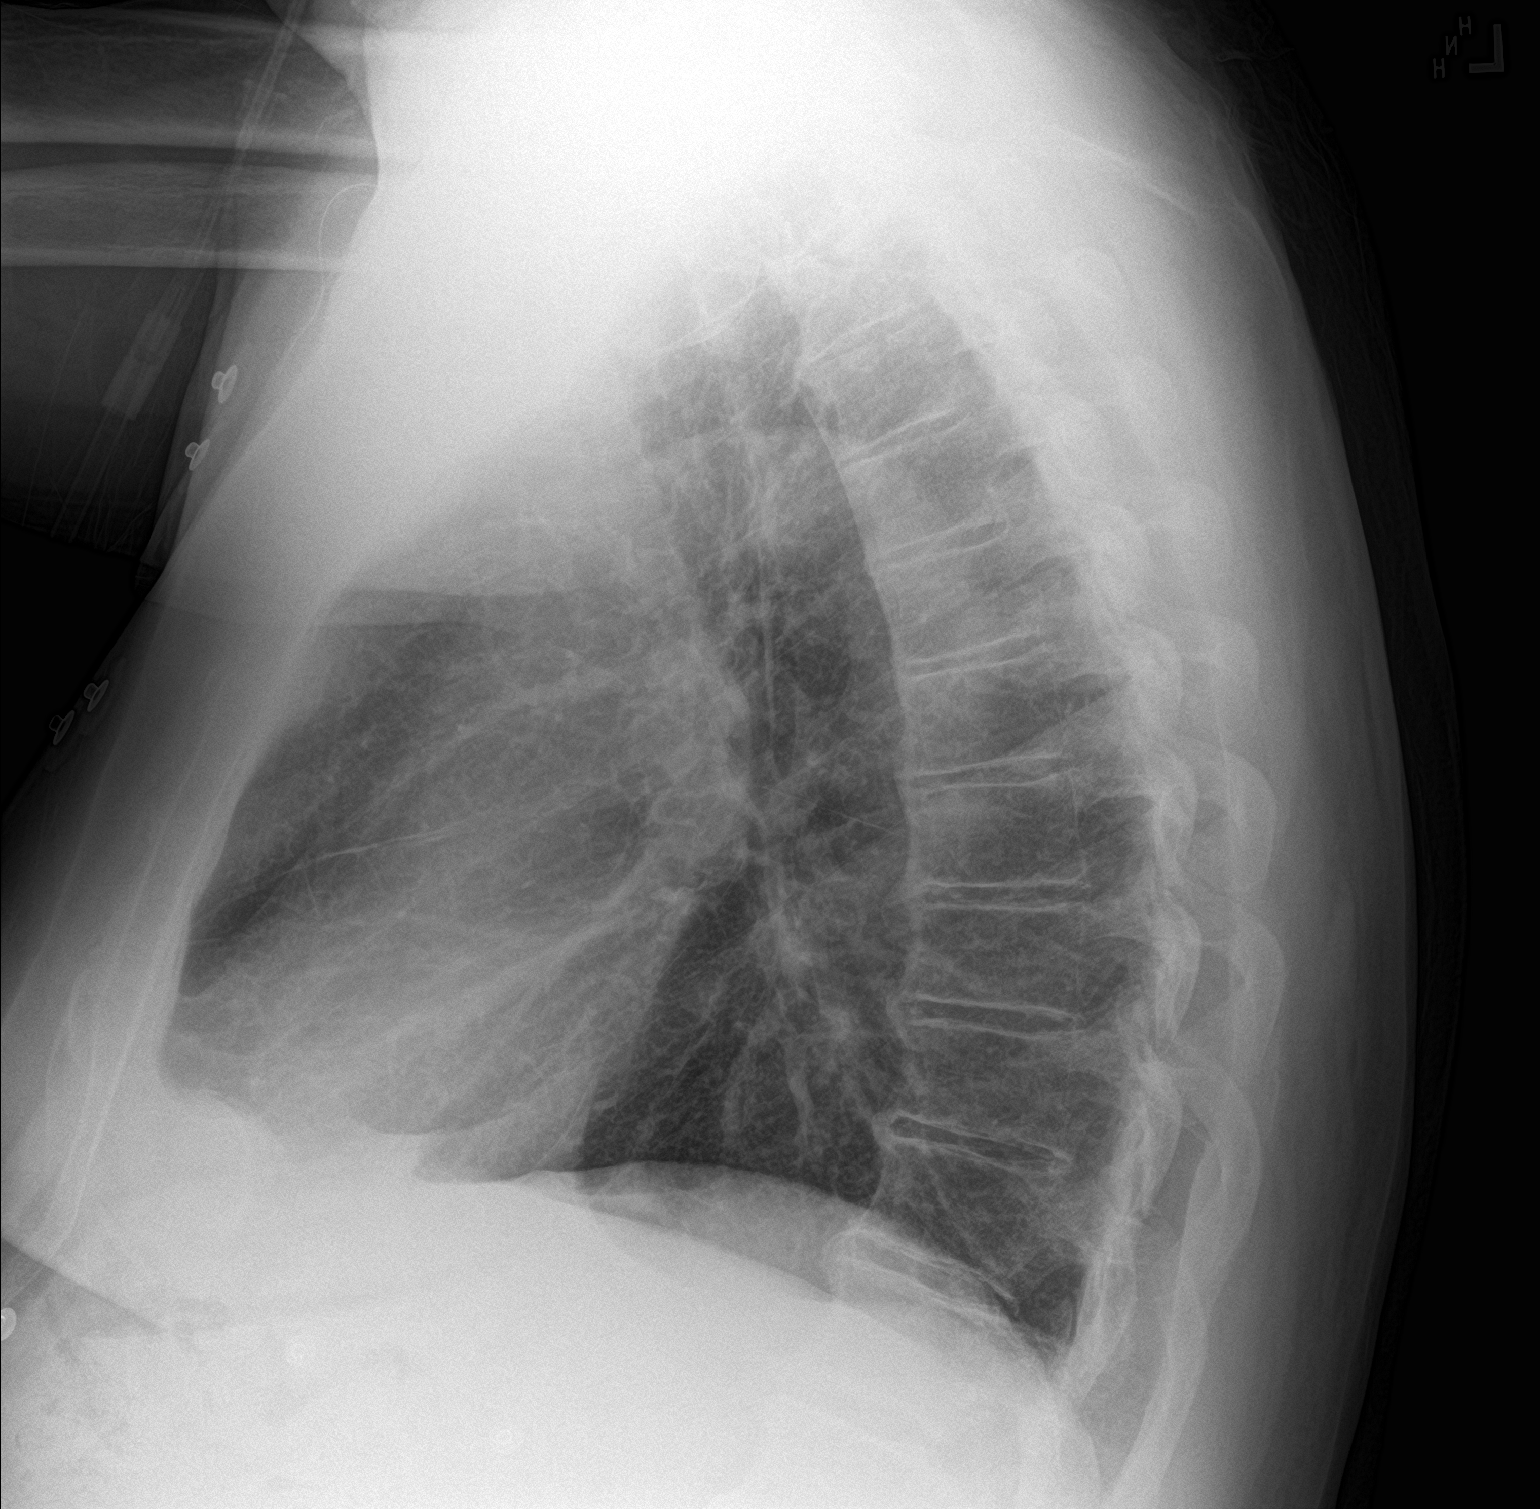

[2 of 2 positions shown; findings below may reference images not displayed]

FINDINGS: Cardiomediastinal silhouette unchanged in size and contour. No
evidence of central vascular congestion. No interlobular septal
thickening. Coarsened interstitial markings throughout. No
pneumothorax or pleural effusion. No confluent airspace disease.
Stigmata of emphysema, with increased retrosternal airspace,
flattened hemidiaphragms, increased AP diameter, and hyperinflation
on the AP view.

Degenerative changes of the spine without acute displaced fracture
IMPRESSION: Chronic lung changes and evidence of emphysema without acute
cardiopulmonary disease

## 2021-12-03 HISTORY — PX: UMBILICAL HERNIA REPAIR: SHX196

## 2022-12-31 ENCOUNTER — Other Ambulatory Visit: Payer: Self-pay | Admitting: Internal Medicine

## 2022-12-31 ENCOUNTER — Ambulatory Visit
Admission: RE | Admit: 2022-12-31 | Discharge: 2022-12-31 | Disposition: A | Discharge status: Home / Self Care | Payer: Medicare Other | Source: Ambulatory Visit | Attending: Internal Medicine | Admitting: Internal Medicine

## 2022-12-31 DIAGNOSIS — J069 Acute upper respiratory infection, unspecified: Secondary | ICD-10-CM

## 2023-01-21 ENCOUNTER — Encounter: Payer: Self-pay | Admitting: Emergency Medicine

## 2023-01-21 ENCOUNTER — Ambulatory Visit
Admission: EM | Admit: 2023-01-21 | Discharge: 2023-01-21 | Disposition: A | Discharge status: Home / Self Care | Payer: 59 | Attending: Physician Assistant | Admitting: Physician Assistant

## 2023-01-21 DIAGNOSIS — H1132 Conjunctival hemorrhage, left eye: Secondary | ICD-10-CM

## 2023-01-21 MED ORDER — POLYMYXIN B-TRIMETHOPRIM 10000-0.1 UNIT/ML-% OP SOLN: 1.0000 [drp] | OPHTHALMIC | 0 refills | Status: AC

## 2023-01-21 NOTE — ED Triage Notes (Addendum)
Left eye was itching yesterday. States he rubbed it and nothing happened. Woke up this morning with what appears to be a subconjunctival hemorrhage, no hyphema visible in triage. Left sided eye redness that does not appear to cross corneal border in left eye. States he did not take his BP meds today either. Denies any pain, foreign body sensation, changes to vision/blurred vision, drainage, itching. States his eye feels normal, but looks bad.   Offered to do visual acuity exam, patient states he does not have his glasses with him

## 2023-01-22 ENCOUNTER — Encounter: Payer: Self-pay | Admitting: Physician Assistant

## 2023-01-22 NOTE — ED Provider Notes (Signed)
[place] [facility]    CSN: [id number] Arrival date & time: 01/21/23  1723      History   Chief Complaint Chief Complaint  Patient presents with   Eye Problem    HPI Glenn Lane is a 67 y.o. male.   Patient here today for evaluation of redness to his left that he first noticed today.  She states that he felt itching in his eye and he rubbed his eye and then developed significant redness overnight. He denies any pain, nausea, headache, photosensitivity or vision changes.   The history is provided by the patient.  Eye Problem Associated symptoms: redness   Associated symptoms: no discharge, no headaches, no itching, no nausea, no numbness, no photophobia and no vomiting     Past Medical History:  Diagnosis Date   Diabetes mellitus    Hypertension     Patient Active Problem List   Diagnosis Date Noted   Branch retinal vein occlusion of right eye with macular edema 05/23/2020   Essential hypertension 02/11/2018   Hyperlipidemia 02/11/2018    History reviewed. No pertinent surgical history.     Home Medications    Prior to Admission medications   Medication Sig Start Date End Date Taking? Authorizing Provider  trimethoprim-polymyxin b (POLYTRIM) ophthalmic solution Place 1 drop into the left eye every 4 (four) hours for 7 days. 01/21/23 01/28/23 Yes Francene Finders, PA-C  Ascorbic Acid (VITAMIN C) 1000 MG tablet Take 2,000 mg by mouth daily.    [provider]  aspirin (EC-81 ASPIRIN) 81 MG EC tablet Take 1 tablet (81 mg total) by mouth daily. Swallow whole. 05/01/20   Nicolette Bang, MD  atorvastatin (LIPITOR) 40 MG tablet Take 1 tablet (40 mg total) by mouth daily. Patient taking differently: Take 80 mg by mouth daily.  04/23/20   Nicolette Bang, MD  diazepam (VALIUM) 5 MG tablet Take 1 tablet (5 mg total) by mouth every 6 (six) hours as needed for anxiety (spasms). 10/18/20   Orpah Greek, MD  hydrochlorothiazide  (HYDRODIURIL) 12.5 MG tablet Take 1 tablet by mouth daily.    [provider]  lisinopril-hydrochlorothiazide (ZESTORETIC) 20-12.5 MG tablet Take 1 tablet by mouth daily. 04/23/20   Nicolette Bang, MD  Multiple Vitamins-Minerals (CENTRUM SILVER PO) Take 1 tablet by mouth daily.    [provider]    Family History Family History  Problem Relation Age of Onset   Heart failure Mother    Heart failure Father    Hyperlipidemia Father     Social History Social History   Tobacco Use   Smoking status: Never   Smokeless tobacco: Never  Substance Use Topics   Alcohol use: Yes    Comment: history of.   Drug use: No     Allergies   Avelox [moxifloxacin hcl in nacl], Alprazolam, and Avelox  [moxifloxacin hcl]   Review of Systems Review of Systems  Constitutional:  Negative for chills and fever.  Eyes:  Positive for redness. Negative for photophobia, pain, discharge, itching and visual disturbance.  Respiratory:  Negative for shortness of breath.   Gastrointestinal:  Negative for nausea and vomiting.  Neurological:  Negative for numbness and headaches.     Physical Exam Triage Vital Signs ED Triage Vitals  Enc Vitals Group     BP 01/21/23 1746 (!) 153/101     Pulse Rate 01/21/23 1746 79     Resp 01/21/23 1746 16     Temp 01/21/23 1746 97.7  F (36.5 C)     Temp Source 01/21/23 1746 Oral     SpO2 01/21/23 1746 91 %     Weight --      Height --      Head Circumference --      Peak Flow --      Pain Score 01/21/23 1747 0     Pain Loc --      Pain Edu? --      Excl. in [place]? --    No data found.  Updated Vital Signs BP (!) 153/101 (BP Location: Left Arm)   Pulse 79   Temp 97.7 F (36.5 C) (Oral)   Resp 16   SpO2 91%      Physical Exam Vitals and nursing note reviewed.  Constitutional:      General: He is not in acute distress.    Appearance: Normal appearance. He is not ill-appearing.  HENT:     Head: Normocephalic and atraumatic.   Eyes:     Extraocular Movements: Extraocular movements intact.     Pupils: Pupils are equal, round, and reactive to light.     Comments: Subconjunctival hemorrhage noted to left lateral eye  Cardiovascular:     Rate and Rhythm: Normal rate.  Pulmonary:     Effort: Pulmonary effort is normal. No respiratory distress.  Neurological:     Mental Status: He is alert.  Psychiatric:        Mood and Affect: Mood normal.        Behavior: Behavior normal.        Thought Content: Thought content normal.      UC Treatments / Results  Labs (all labs ordered are listed, but only abnormal results are displayed) Labs Reviewed - No data to display  EKG   Radiology No results found.  Procedures Procedures (including critical care time)  Medications Ordered in UC Medications - No data to display  Initial Impression / Assessment and Plan / UC Course  I have reviewed the triage vital signs and the nursing notes.  Pertinent labs & imaging results that were available during my care of the patient were reviewed by me and considered in my medical decision making (see chart for details).   Discussed benign nature of subconjunctival hemorrhage. Patient would like coverage for conjunctivitis which seems reasonable given reported itching initially. Encouraged follow up if no gradual improvement or with any further concerns.   Final Clinical Impressions(s) / UC Diagnoses   Final diagnoses:  Subconjunctival hemorrhage of left eye   Discharge Instructions   None    ED Prescriptions     Medication Sig Dispense Auth. Provider   trimethoprim-polymyxin b (POLYTRIM) ophthalmic solution Place 1 drop into the left eye every 4 (four) hours for 7 days. 10 mL Francene Finders, PA-C      PDMP not reviewed this encounter.   Francene Finders, PA-C 01/22/23 1319

## 2023-02-24 ENCOUNTER — Encounter (HOSPITAL_COMMUNITY): Payer: Self-pay | Admitting: Anesthesiology

## 2023-02-24 ENCOUNTER — Encounter (HOSPITAL_COMMUNITY): Payer: Self-pay | Admitting: Ophthalmology

## 2023-02-24 NOTE — Progress Notes (Signed)
PCP - [facility] Cardiologist - n/a  Chest x-ray - 12/31/22 (2V) EKG - DOS Stress Test - n/a ECHO - n/a Cardiac Cath - n/a  ICD Pacemaker/Loop - n/a  Sleep Study -  n/a CPAP - none  Diabetes Type 2- no meds, diet controlled  If your blood sugar is less than 70 mg/dL, you will need to treat for low blood sugar: Treat a low blood sugar (less than 70 mg/dL) with  cup of clear juice (cranberry or apple), 4 glucose tablets, OR glucose gel. Recheck blood sugar in 15 minutes after treatment (to make sure it is greater than 70 mg/dL). If your blood sugar is not greater than 70 mg/dL on recheck, call [phone] for further instructions. . NPO  Anesthesia review: No  STOP now taking Celebrex, any Aspirin (unless otherwise instructed by your surgeon), Aleve, Naproxen, Ibuprofen, Motrin, Advil, Goody's, BC's, all herbal medications, fish oil, and all vitamins.   Coronavirus Screening Do you have any of the following symptoms:  Cough yes/no: No Fever (>100.1F)  yes/no: No Runny nose yes/no: No Sore throat yes/no: No Difficulty breathing/shortness of breath  yes/no: No  Have you traveled in the last 14 days and where? yes/no: No  Patient verbalized understanding of instructions that were given via phone.

## 2023-02-25 ENCOUNTER — Ambulatory Visit (HOSPITAL_COMMUNITY): Admission: RE | Admit: 2023-02-25 | Payer: 59 | Source: Home / Self Care | Admitting: Ophthalmology

## 2023-02-25 HISTORY — DX: Allergy status to unspecified drugs, medicaments and biological substances: Z88.9

## 2023-02-25 SURGERY — PARS PLANA VITRECTOMY WITH 25 GAUGE
Anesthesia: Monitor Anesthesia Care | Laterality: Right

## 2023-06-28 ENCOUNTER — Encounter (HOSPITAL_COMMUNITY): Payer: Self-pay

## 2023-06-28 ENCOUNTER — Other Ambulatory Visit: Payer: Self-pay

## 2023-06-28 ENCOUNTER — Encounter (HOSPITAL_COMMUNITY)
Admission: RE | Admit: 2023-06-28 | Discharge: 2023-06-28 | Disposition: A | Discharge status: Home / Self Care | Payer: 59 | Source: Ambulatory Visit | Attending: Otolaryngology | Admitting: Otolaryngology

## 2023-06-28 VITALS — BP 148/98 | HR 72 | Temp 97.9°F | Resp 17 | Ht 77.0 in | Wt 333.8 lb

## 2023-06-28 DIAGNOSIS — Z01818 Encounter for other preprocedural examination: Secondary | ICD-10-CM | POA: Insufficient documentation

## 2023-06-28 HISTORY — DX: Pneumonia, unspecified organism: J18.9

## 2023-06-28 HISTORY — DX: Gastro-esophageal reflux disease without esophagitis: K21.9

## 2023-06-28 HISTORY — DX: Personal history of urinary calculi: Z87.442

## 2023-06-28 HISTORY — DX: Unspecified osteoarthritis, unspecified site: M19.90

## 2023-06-28 HISTORY — DX: Chronic obstructive pulmonary disease, unspecified: J44.9

## 2023-06-28 HISTORY — DX: Pure hypercholesterolemia, unspecified: E78.00

## 2023-06-28 HISTORY — DX: Prediabetes: R73.03

## 2023-06-28 LAB — CBC
HCT: 51.2 % (ref 39.0–52.0)
Hemoglobin: 17.3 g/dL — ABNORMAL HIGH (ref 13.0–17.0)
MCH: 31 pg (ref 26.0–34.0)
MCHC: 33.8 g/dL (ref 30.0–36.0)
MCV: 91.8 fL (ref 80.0–100.0)
Platelets: 188 10*3/uL (ref 150–400)
RBC: 5.58 MIL/uL (ref 4.22–5.81)
RDW: 12.6 % (ref 11.5–15.5)
WBC: 7 10*3/uL (ref 4.0–10.5)
nRBC: 0 % (ref 0.0–0.2)

## 2023-06-28 LAB — BASIC METABOLIC PANEL
Anion gap: 12 (ref 5–15)
BUN: 11 mg/dL (ref 8–23)
CO2: 23 mmol/L (ref 22–32)
Calcium: 9.1 mg/dL (ref 8.9–10.3)
Chloride: 106 mmol/L (ref 98–111)
Creatinine, Ser: 1.03 mg/dL (ref 0.61–1.24)
GFR, Estimated: >60 mL/min (ref >60)
Glucose, Bld: 120 mg/dL — ABNORMAL HIGH (ref 70–99)
Potassium: 4.6 mmol/L (ref 3.5–5.1)
Sodium: 141 mmol/L (ref 135–145)

## 2023-06-28 NOTE — Progress Notes (Signed)
Surgical Instructions   Your procedure is scheduled on Wednesday, June 07, 2023 at 1:30 PM.  Report to [facility] Main Entrance "A" at 11:30 AM, then check in with the Admitting office.  Call this number if you have problems the morning of surgery:  [phone]   If you have any questions prior to your surgery date call [phone]: Open Monday-Friday 8am-4pm If you experience any cold or flu symptoms such as cough, fever, chills, shortness of breath, etc. between now and your scheduled surgery, please notify us at the above number    Remember:  Do not eat after midnight the night before your surgery  You may drink clear liquids until 10:30 AM the morning of your surgery.   Clear liquids allowed are: Water, Non-Citrus Juices (without pulp), Carbonated Beverages, Clear Tea, Black Coffee ONLY (NO MILK, CREAM OR POWDERED CREAMER of any kind), and Gatorade    Take these medicines the morning of surgery with A SIP OF WATER: Cyclobenzaprine (Flexeril) - if needed,  Diazepam (Valium) - if needed,  Omeprazole (Prilosec) - if needed, may use eye drops, Albuterol inhaler - if needed (bring day of surgery)  As of Wednesday, July 3rd, STOP taking any Aspirin (unless otherwise instructed by your surgeon) Aleve, Naproxen, Ibuprofen, Motrin, Advil, Goody's, BC's, all herbal medications, fish oil and all vitamins.         Do not wear jewelry. Do not wear lotions, powders, cologne or deodorant. Do not shave 48 hours prior to surgery.  Men may shave face and neck. Do not bring valuables to the hospital. Do not wear nail polish, gel polish, artificial nails, or any other type of covering on natural nails (fingers and toes) If you have artificial nails or gel coating that need to be removed by a nail salon, please have this removed prior to surgery. Artificial nails or gel coating may interfere with anesthesia's ability to adequately monitor your vital signs.  [facility] is not responsible for any  belongings or valuables.    Contacts, glasses, hearing aids, dentures or partials may not be worn into surgery, please bring cases for these belongings   For patients admitted to the hospital, discharge time will be determined by your treatment team.   Patients discharged the day of surgery will not be allowed to drive home, and someone needs to stay with them for 24 hours.   SURGICAL WAITING ROOM VISITATION Patients having surgery or a procedure may have no more than 2 support people in the waiting area - these visitors may rotate.   Children under the age of 51 must have an adult with them who is not the patient. If the patient needs to stay at the hospital during part of their recovery, the visitor guidelines for inpatient rooms apply. Pre-op nurse will coordinate an appropriate time for 1 support person to accompany patient in pre-op.  This support person may not rotate.   Please refer to [email or web address] for the visitor guidelines for Inpatients (after your surgery is over and you are in a regular room).   Special instructions:    Oral Hygiene is also important to reduce your risk of infection.  Remember - BRUSH YOUR TEETH THE MORNING OF SURGERY WITH YOUR REGULAR TOOTHPASTE  [facility]- Preparing For Surgery  Before surgery, you can play an important role. Because skin is not sterile, your skin needs to be as free of germs as possible. You can reduce the number of germs on your skin by washing with  CHG (chlorahexidine gluconate) Soap before surgery.  CHG is an antiseptic cleaner which kills germs and bonds with the skin to continue killing germs even after washing.    Please do not use if you have an allergy to CHG or antibacterial soaps. If your skin becomes reddened/irritated stop using the CHG.  Do not shave (including legs and underarms) for at least 48 hours prior to first CHG shower. It is OK to shave your face.  Please  follow these instructions carefully.    Shower the NIGHT BEFORE SURGERY and the MORNING OF SURGERY with CHG Soap.   If you chose to wash your hair, wash your hair first as usual with your normal shampoo. After you shampoo, rinse your hair and body thoroughly to remove the shampoo.  Then [employer] and genitals (private parts) with your normal soap and rinse thoroughly to remove soap.  After that Use CHG Soap as you would any other liquid soap. You can apply CHG directly to the skin and wash gently with a scrungie or a clean washcloth.   Apply the CHG Soap to your body ONLY FROM THE NECK DOWN.  Do not use on open wounds or open sores. Avoid contact with your eyes, ears, mouth and genitals (private parts). Wash Face and genitals (private parts)  with your normal soap.   Wash thoroughly, paying special attention to the area where your surgery will be performed.  Thoroughly rinse your body with warm water from the neck down.  DO NOT shower/wash with your normal soap after using and rinsing off the CHG Soap.  Pat yourself dry with a CLEAN TOWEL.  Wear CLEAN PAJAMAS to bed the night before surgery  Place CLEAN SHEETS on your bed the night before your surgery  DO NOT SLEEP WITH PETS.  Day of Surgery: Take a shower with CHG soap. Wear Clean/Comfortable clothing the morning of surgery Do not apply any deodorants/lotions.   Remember to brush your teeth WITH YOUR REGULAR TOOTHPASTE.   Please read over the fact sheets that you were given.

## 2023-06-28 NOTE — Progress Notes (Addendum)
PCP - [facility]  Chest x-ray - 12/31/22 EKG - today Stress Test - no ECHO - no Cardiac Cath - no  Sleep Study - no (positive Stop Bang)  ERAS Protcol - no Ensure/G2  Anesthesia review: no  Patient denies shortness of breath, fever, cough and chest pain at PAT appointment   All instructions explained to the patient, with a verbal understanding of the material. Patient agrees to go over the instructions while at home for a better understanding. Patient also instructed to self quarantine after being tested for COVID-19. The opportunity to ask questions was provided.

## 2023-06-28 NOTE — Progress Notes (Signed)
   06/28/23 1552  OBSTRUCTIVE SLEEP APNEA  Have you ever been diagnosed with sleep apnea through a sleep study? No  Do you snore loudly (loud enough to be heard through closed doors)?  0  Do you often feel tired, fatigued, or sleepy during the daytime (such as falling asleep during driving or talking to someone)? 0  Has anyone observed you stop breathing during your sleep? 0  Do you have, or are you being treated for high blood pressure? 1  BMI more than 35 kg/m2? 1  Age > 50 (1-yes) 1  Neck circumference greater than:Male 16 inches or larger, Male 17inches or larger? 1  Male Gender (Yes=1) 1  Obstructive Sleep Apnea Score 5  Score 5 or greater   --

## 2023-07-02 NOTE — H&P (Signed)
HPI:  Glenn Lane is a 67 y.o. male who presents as a consult Patient.  Referring Provider: Elvina Mattes, NP  Chief complaint: Parotid mass.  HPI: Left parotid mass, he noticed a couple of years ago. He does have some problems with arthritis in his neck and sometimes has pain in his neck. No history of skin cancer. He does not smoke.  PMH/Meds/All/SocHx/FamHx/ROS:  Past Medical History: Diagnosis Date Arthritis Branch retinal vein occlusion of right eye with macular edema 05/23/2020 Essential hypertension 02/11/2018 Hyperlipidemia 02/11/2018 Hypertension Mild emphysema (CMS/HCC) [place] Umbilical hernia without obstruction and without gangrene 11/25/2021 Added automatically from request for surgery [id number]  Past Surgical History: Procedure Laterality Date CYST REMOVAL Procedure: CYST REMOVAL; ARMS, BACK, ABDOMEN UMBILICAL HERNIA REPAIR N/A 12/03/2021 Procedure: UMBILICAL HERNIA REPAIR WITH MESH, ROBOTIC; Surgeon: Lanice Schwab, MD; Location: [facility] MAIN OR; Service: General; Laterality: N/A; WISDOM TOOTH EXTRACTION Procedure: WISDOM TOOTH EXTRACTION  No family history of bleeding disorders, wound healing problems or difficulty with anesthesia.    Current Outpatient Medications: albuterol HFA (PROVENTIL HFA;VENTOLIN HFA;PROAIR HFA) 90 mcg/actuation inhaler, INHALE 1 PUFF EVERY 6 HOURS AS NEEDED FOR SHORTNESS OF BREATH AND/OR WHEEZING, Disp: , Rfl: amLODIPine (NORVASC) 5 mg tablet, , Disp: , Rfl: atorvastatin (LIPITOR) 80 mg tablet, Take 80 mg by mouth nightly., Disp: , Rfl: celecoxib (CeleBREX) 100 mg capsule, Take 100 mg by mouth., Disp: , Rfl: cholecalciferol (VITAMIN D3) 5,000 unit (125 mcg) tab tablet, Take 5,000 Units by mouth Once Daily., Disp: , Rfl: co-enzyme Q-10 capsule, Take 30 mg by mouth Once Daily., Disp: , Rfl: cyclobenzaprine (FLEXERIL) 10 mg tablet, Take 10 mg by mouth 3 (three) times a day as needed for muscle spasms., Disp: , Rfl: diazePAM (VALIUM) 5  mg tablet, TAKE ONE TABLET BY MOUTH TWICE DAILY AS NEEDED FOR ANXIETY. USE SPARINGLY, Disp: , Rfl: DOCOSAHEXAENOIC ACID ORAL, Take 1 g by mouth daily., Disp: , Rfl: loratadine (CLARITIN) 10 mg tablet, Take 10 mg by mouth., Disp: , Rfl: LYCOPENE ORAL, Take 1 tablet by mouth daily., Disp: , Rfl: metoprolol succinate (TOPROL XL) 25 mg 24 hr tablet, Take 25 mg by mouth Once Daily., Disp: , Rfl: multivit-minerals-folic acid-lycopen-lutein (CertaVite Senior) 0.4 mg-300 mcg- 250 mcg tab tablet, Take 1 tablet by mouth Once Daily., Disp: , Rfl: naphazoline-glycerin 0.03-0.5 % drop, Administer 1 drop into affected eye(s)., Disp: , Rfl: omega 3-dha-epa-fish oil (OMEGA 3) 1,000 mg capsule, Take 1 g by mouth Once Daily., Disp: , Rfl: triamcinolone (KENALOG) 0.5 % cream, APPLY A THIN LAYER TO AFFECTED AREA EVERY MORNING AND EVERY EVENING, Disp: , Rfl:  A complete ROS was performed with pertinent positives/negatives noted in the HPI. The remainder of the ROS are negative.   Physical Exam:  Pulse 88  Temp 98 F (36.7 C) (Temporal)  Resp 18  Ht 1.956 m (6\' 5" )  Wt (!) 152 kg (335 lb)  SpO2 97%  BMI 39.73 kg/m  General: Healthy and alert, in no distress, breathing easily. Normal affect. In a pleasant mood. Head: Normocephalic, atraumatic. No masses, or scars. Eyes: Pupils are equal, and reactive to light. Vision is grossly intact. No spontaneous or gaze nystagmus. Ears: Ear canals are clear. Tympanic membranes are intact, with normal landmarks and the middle ears are clear and healthy. Hearing: Grossly normal. Nose: Nasal cavities are clear with healthy mucosa, no polyps or exudate. Airways are patent. Face: Left parotid mass, rubbery, approximately 2 cm, mobile, otherwise no masses or scars, facial nerve function is symmetric. Oral Cavity: No mucosal  abnormalities are noted. Tongue with normal mobility. Dentition appears healthy. Oropharynx: Tonsils are symmetric. There are no mucosal masses  identified. Tongue base appears normal and healthy. Larynx/Hypopharynx: deferred Chest: Deferred Neck: No palpable masses, no cervical adenopathy, no thyroid nodules or enlargement. Neuro: Cranial nerves II-XII with normal function. Balance: Normal gate. Other findings: none.  Independent Review of Additional Tests or Records: none  Procedures: none  Impression & Plans: Left parotid mass. We discussed that 90% of these are benign but 10% can be cancerous. They can also turn malignant over time if left alone. Recommend parotidectomy with facial nerve dissection. We discussed the incision, overnight stay, drain, potential for nerve weakness or paralysis, numbness of the left ear, among other potential side effects or complications. He understands and agrees.

## 2023-07-07 ENCOUNTER — Observation Stay (HOSPITAL_COMMUNITY)
Admission: RE | Admit: 2023-07-07 | Discharge: 2023-07-08 | Disposition: A | Discharge status: Home / Self Care | Payer: 59 | Attending: Otolaryngology | Admitting: Otolaryngology

## 2023-07-07 ENCOUNTER — Other Ambulatory Visit: Payer: Self-pay

## 2023-07-07 ENCOUNTER — Encounter (HOSPITAL_COMMUNITY)
Admission: RE | Disposition: A | Discharge status: Home / Self Care | Payer: Self-pay | Source: Home / Self Care | Attending: Otolaryngology

## 2023-07-07 ENCOUNTER — Encounter (HOSPITAL_COMMUNITY): Payer: Self-pay | Admitting: Otolaryngology

## 2023-07-07 ENCOUNTER — Ambulatory Visit (HOSPITAL_COMMUNITY): Payer: 59 | Admitting: Certified Registered Nurse Anesthetist

## 2023-07-07 ENCOUNTER — Ambulatory Visit (HOSPITAL_BASED_OUTPATIENT_CLINIC_OR_DEPARTMENT_OTHER): Payer: 59 | Admitting: Certified Registered Nurse Anesthetist

## 2023-07-07 DIAGNOSIS — Z01818 Encounter for other preprocedural examination: Secondary | ICD-10-CM

## 2023-07-07 DIAGNOSIS — Z87891 Personal history of nicotine dependence: Secondary | ICD-10-CM | POA: Diagnosis not present

## 2023-07-07 DIAGNOSIS — I10 Essential (primary) hypertension: Secondary | ICD-10-CM | POA: Insufficient documentation

## 2023-07-07 DIAGNOSIS — R221 Localized swelling, mass and lump, neck: Secondary | ICD-10-CM | POA: Diagnosis present

## 2023-07-07 DIAGNOSIS — J449 Chronic obstructive pulmonary disease, unspecified: Secondary | ICD-10-CM

## 2023-07-07 DIAGNOSIS — Z79899 Other long term (current) drug therapy: Secondary | ICD-10-CM | POA: Diagnosis not present

## 2023-07-07 DIAGNOSIS — K118 Other diseases of salivary glands: Secondary | ICD-10-CM | POA: Diagnosis not present

## 2023-07-07 HISTORY — PX: PAROTIDECTOMY: SHX2163

## 2023-07-07 LAB — GLUCOSE, CAPILLARY: Glucose-Capillary: 100 mg/dL — ABNORMAL HIGH (ref 70–99)

## 2023-07-07 SURGERY — EXCISION, PAROTID GLAND
Anesthesia: General | Site: Neck | Laterality: Left

## 2023-07-07 MED ORDER — LACTATED RINGERS IV SOLN: INTRAVENOUS | Status: DC

## 2023-07-07 MED ORDER — PROPOFOL 10 MG/ML IV BOLUS
INTRAVENOUS | Status: AC
  Filled 2023-07-07: qty 20

## 2023-07-07 MED ORDER — MIDAZOLAM HCL 2 MG/2ML IJ SOLN
INTRAMUSCULAR | Status: AC
  Filled 2023-07-07: qty 2

## 2023-07-07 MED ORDER — CYCLOBENZAPRINE HCL 10 MG PO TABS: 10.0000 mg | ORAL_TABLET | Freq: Two times a day (BID) | ORAL | Status: DC | PRN

## 2023-07-07 MED ORDER — DEXTROSE-SODIUM CHLORIDE 5-0.9 % IV SOLN: INTRAVENOUS | Status: DC

## 2023-07-07 MED ORDER — ONDANSETRON HCL 4 MG/2ML IJ SOLN
INTRAMUSCULAR | Status: AC
  Filled 2023-07-07: qty 2

## 2023-07-07 MED ORDER — ONDANSETRON HCL 4 MG PO TABS: 4.0000 mg | ORAL_TABLET | ORAL | Status: DC | PRN

## 2023-07-07 MED ORDER — HYDROCODONE-ACETAMINOPHEN 5-325 MG PO TABS
1.0000 | ORAL_TABLET | ORAL | Status: DC | PRN
  Administered 2023-07-07: 2 via ORAL
  Filled 2023-07-07: qty 2

## 2023-07-07 MED ORDER — NAPHAZOLINE-PHENIRAMINE 0.025-0.3 % OP SOLN
1.0000 [drp] | Freq: Every day | OPHTHALMIC | Status: DC
  Administered 2023-07-08: 1 [drp] via OPHTHALMIC
  Filled 2023-07-07: qty 15

## 2023-07-07 MED ORDER — CELECOXIB 100 MG PO CAPS: 100.0000 mg | ORAL_CAPSULE | Freq: Two times a day (BID) | ORAL | Status: DC | PRN

## 2023-07-07 MED ORDER — SUCCINYLCHOLINE CHLORIDE 200 MG/10ML IV SOSY
PREFILLED_SYRINGE | INTRAVENOUS | Status: DC | PRN
  Administered 2023-07-07: 200 mg via INTRAVENOUS

## 2023-07-07 MED ORDER — DEXAMETHASONE SODIUM PHOSPHATE 10 MG/ML IJ SOLN
INTRAMUSCULAR | Status: AC
  Filled 2023-07-07: qty 1

## 2023-07-07 MED ORDER — FENTANYL CITRATE (PF) 250 MCG/5ML IJ SOLN
INTRAMUSCULAR | Status: DC | PRN
  Administered 2023-07-07: 100 ug via INTRAVENOUS
  Administered 2023-07-07: 50 ug via INTRAVENOUS

## 2023-07-07 MED ORDER — HYDROCODONE-ACETAMINOPHEN 7.5-325 MG PO TABS: 1.0000 | ORAL_TABLET | Freq: Four times a day (QID) | ORAL | 0 refills | Status: AC | PRN

## 2023-07-07 MED ORDER — METOPROLOL SUCCINATE ER 25 MG PO TB24
50.0000 mg | ORAL_TABLET | Freq: Every day | ORAL | Status: DC
  Administered 2023-07-07: 50 mg via ORAL
  Filled 2023-07-07: qty 2

## 2023-07-07 MED ORDER — PHENOL 1.4 % MT LIQD
1.0000 | OROMUCOSAL | Status: DC | PRN
  Administered 2023-07-07: 1 via OROMUCOSAL
  Filled 2023-07-07: qty 177

## 2023-07-07 MED ORDER — AMLODIPINE BESYLATE 5 MG PO TABS
5.0000 mg | ORAL_TABLET | Freq: Every day | ORAL | Status: DC
  Administered 2023-07-07: 5 mg via ORAL
  Filled 2023-07-07: qty 1

## 2023-07-07 MED ORDER — IBUPROFEN 100 MG/5ML PO SUSP: 400.0000 mg | Freq: Four times a day (QID) | ORAL | Status: DC | PRN

## 2023-07-07 MED ORDER — ONDANSETRON HCL 4 MG/2ML IJ SOLN: 4.0000 mg | INTRAMUSCULAR | Status: DC | PRN

## 2023-07-07 MED ORDER — ADULT MULTIVITAMIN W/MINERALS CH
1.0000 | ORAL_TABLET | Freq: Every morning | ORAL | Status: DC
  Administered 2023-07-08: 1 via ORAL
  Filled 2023-07-07: qty 1

## 2023-07-07 MED ORDER — CHLORHEXIDINE GLUCONATE 0.12 % MT SOLN
15.0000 mL | Freq: Once | OROMUCOSAL | Status: AC
  Administered 2023-07-07: 15 mL via OROMUCOSAL
  Filled 2023-07-07: qty 15

## 2023-07-07 MED ORDER — OMEGA-3-ACID ETHYL ESTERS 1 G PO CAPS
1.0000 g | ORAL_CAPSULE | Freq: Every day | ORAL | Status: DC
  Administered 2023-07-08: 1 g via ORAL
  Filled 2023-07-07: qty 1

## 2023-07-07 MED ORDER — DIAZEPAM 5 MG PO TABS: 5.0000 mg | ORAL_TABLET | Freq: Two times a day (BID) | ORAL | Status: DC | PRN

## 2023-07-07 MED ORDER — VITAMIN D 25 MCG (1000 UNIT) PO TABS
2000.0000 [IU] | ORAL_TABLET | Freq: Every day | ORAL | Status: DC
  Administered 2023-07-08: 2000 [IU] via ORAL
  Filled 2023-07-07: qty 2

## 2023-07-07 MED ORDER — ROCURONIUM BROMIDE 10 MG/ML (PF) SYRINGE
PREFILLED_SYRINGE | INTRAVENOUS | Status: AC
  Filled 2023-07-07: qty 10

## 2023-07-07 MED ORDER — ROCURONIUM BROMIDE 10 MG/ML (PF) SYRINGE
PREFILLED_SYRINGE | INTRAVENOUS | Status: DC | PRN
  Administered 2023-07-07: 10 mg via INTRAVENOUS

## 2023-07-07 MED ORDER — COQ10 200 MG PO CAPS: 200.0000 mg | ORAL_CAPSULE | Freq: Every day | ORAL | Status: DC

## 2023-07-07 MED ORDER — SUCCINYLCHOLINE CHLORIDE 200 MG/10ML IV SOSY
PREFILLED_SYRINGE | INTRAVENOUS | Status: AC
  Filled 2023-07-07: qty 10

## 2023-07-07 MED ORDER — DEXAMETHASONE SODIUM PHOSPHATE 10 MG/ML IJ SOLN
INTRAMUSCULAR | Status: DC | PRN
  Administered 2023-07-07: 10 mg via INTRAVENOUS

## 2023-07-07 MED ORDER — FENTANYL CITRATE (PF) 100 MCG/2ML IJ SOLN
25.0000 ug | INTRAMUSCULAR | Status: DC | PRN
  Administered 2023-07-07: 25 ug via INTRAVENOUS
  Administered 2023-07-07: 50 ug via INTRAVENOUS
  Administered 2023-07-07: 25 ug via INTRAVENOUS

## 2023-07-07 MED ORDER — ONDANSETRON 4 MG PO TBDP: 4.0000 mg | ORAL_TABLET | Freq: Three times a day (TID) | ORAL | 0 refills | Status: AC | PRN

## 2023-07-07 MED ORDER — PROPOFOL 10 MG/ML IV BOLUS
INTRAVENOUS | Status: DC | PRN
  Administered 2023-07-07: 50 mg via INTRAVENOUS
  Administered 2023-07-07: 200 mg via INTRAVENOUS

## 2023-07-07 MED ORDER — ALBUTEROL SULFATE (2.5 MG/3ML) 0.083% IN NEBU: 2.5000 mg | INHALATION_SOLUTION | Freq: Four times a day (QID) | RESPIRATORY_TRACT | Status: DC | PRN

## 2023-07-07 MED ORDER — ATORVASTATIN CALCIUM 80 MG PO TABS
80.0000 mg | ORAL_TABLET | Freq: Every day | ORAL | Status: DC
  Administered 2023-07-07: 80 mg via ORAL
  Filled 2023-07-07: qty 1

## 2023-07-07 MED ORDER — PANTOPRAZOLE SODIUM 20 MG PO TBEC: 20.0000 mg | DELAYED_RELEASE_TABLET | Freq: Every day | ORAL | Status: DC | PRN

## 2023-07-07 MED ORDER — FENTANYL CITRATE (PF) 100 MCG/2ML IJ SOLN
INTRAMUSCULAR | Status: AC
  Filled 2023-07-07: qty 2

## 2023-07-07 MED ORDER — 0.9 % SODIUM CHLORIDE (POUR BTL) OPTIME
TOPICAL | Status: DC | PRN
  Administered 2023-07-07: 1000 mL

## 2023-07-07 MED ORDER — LORATADINE 10 MG PO TABS
10.0000 mg | ORAL_TABLET | Freq: Every day | ORAL | Status: DC
  Administered 2023-07-07: 10 mg via ORAL
  Filled 2023-07-07: qty 1

## 2023-07-07 MED ORDER — FENTANYL CITRATE (PF) 250 MCG/5ML IJ SOLN
INTRAMUSCULAR | Status: AC
  Filled 2023-07-07: qty 5

## 2023-07-07 MED ORDER — PHENYLEPHRINE 80 MCG/ML (10ML) SYRINGE FOR IV PUSH (FOR BLOOD PRESSURE SUPPORT)
PREFILLED_SYRINGE | INTRAVENOUS | Status: DC | PRN
  Administered 2023-07-07: 160 ug via INTRAVENOUS
  Administered 2023-07-07 (×2): 80 ug via INTRAVENOUS
  Administered 2023-07-07: 160 ug via INTRAVENOUS

## 2023-07-07 MED ORDER — ORAL CARE MOUTH RINSE: 15.0000 mL | Freq: Once | OROMUCOSAL | Status: AC

## 2023-07-07 MED ORDER — ONDANSETRON HCL 4 MG/2ML IJ SOLN
INTRAMUSCULAR | Status: DC | PRN
  Administered 2023-07-07: 4 mg via INTRAVENOUS

## 2023-07-07 MED ORDER — PHENYLEPHRINE 80 MCG/ML (10ML) SYRINGE FOR IV PUSH (FOR BLOOD PRESSURE SUPPORT)
PREFILLED_SYRINGE | INTRAVENOUS | Status: AC
  Filled 2023-07-07: qty 10

## 2023-07-07 MED ORDER — TRIAMCINOLONE ACETONIDE 0.5 % EX CREA: 1.0000 | TOPICAL_CREAM | Freq: Two times a day (BID) | CUTANEOUS | Status: DC | PRN

## 2023-07-07 MED ORDER — MIDAZOLAM HCL 5 MG/5ML IJ SOLN
INTRAMUSCULAR | Status: DC | PRN
  Administered 2023-07-07: 2 mg via INTRAVENOUS

## 2023-07-07 MED ORDER — MENTHOL 3 MG MT LOZG
1.0000 | LOZENGE | OROMUCOSAL | Status: DC | PRN
  Filled 2023-07-07: qty 9

## 2023-07-07 MED ORDER — ALBUTEROL SULFATE HFA 108 (90 BASE) MCG/ACT IN AERS: 1.0000 | INHALATION_SPRAY | Freq: Four times a day (QID) | RESPIRATORY_TRACT | Status: DC | PRN

## 2023-07-07 SURGICAL SUPPLY — 35 items
ADH SKN CLS APL DERMABOND .7 (GAUZE/BANDAGES/DRESSINGS) ×1
BAG COUNTER SPONGE SURGICOUNT (BAG) ×1 IMPLANT
BAG SPNG CNTER NS LX DISP (BAG) ×1
CANISTER SUCT 3000ML PPV (MISCELLANEOUS) ×1 IMPLANT
CLEANER TIP ELECTROSURG 2X2 (MISCELLANEOUS) ×1 IMPLANT
CORD BIPOLAR FORCEPS 12FT (ELECTRODE) ×1 IMPLANT
COVER SURGICAL LIGHT HANDLE (MISCELLANEOUS) ×1 IMPLANT
DERMABOND ADVANCED .7 DNX12 (GAUZE/BANDAGES/DRESSINGS) ×1 IMPLANT
DRAIN JACKSON RD 7FR 3/32 (WOUND CARE) IMPLANT
DRAPE INCISE 23X17 STRL (DRAPES) ×1 IMPLANT
DRAPE INCISE IOBAN 23X17 STRL (DRAPES) ×1 IMPLANT
ELECT COATED BLADE 2.86 ST (ELECTRODE) ×1 IMPLANT
ELECT REM PT RETURN 9FT ADLT (ELECTROSURGICAL) ×1
ELECTRODE REM PT RTRN 9FT ADLT (ELECTROSURGICAL) ×1 IMPLANT
EVACUATOR SILICONE 100CC (DRAIN) IMPLANT
FORCEPS BIPOLAR SPETZLER 8 1.0 (NEUROSURGERY SUPPLIES) ×1 IMPLANT
GAUZE 4X4 16PLY ~~LOC~~+RFID DBL (SPONGE) ×1 IMPLANT
GLOVE ECLIPSE 7.5 STRL STRAW (GLOVE) ×1 IMPLANT
GOWN STRL REUS W/ TWL LRG LVL3 (GOWN DISPOSABLE) ×2 IMPLANT
GOWN STRL REUS W/TWL LRG LVL3 (GOWN DISPOSABLE) ×3
KIT BASIN OR (CUSTOM PROCEDURE TRAY) ×1 IMPLANT
KIT TURNOVER KIT B (KITS) ×1 IMPLANT
NS IRRIG 1000ML POUR BTL (IV SOLUTION) ×1 IMPLANT
PAD ARMBOARD 7.5X6 YLW CONV (MISCELLANEOUS) ×2 IMPLANT
PENCIL FOOT CONTROL (ELECTRODE) ×1 IMPLANT
SHEARS HARMONIC 9CM CVD (BLADE) ×1 IMPLANT
STAPLER VISISTAT 35W (STAPLE) ×1 IMPLANT
SUT CHROMIC 3 0 SH 27 (SUTURE) IMPLANT
SUT MNCRL AB 4-0 PS2 18 (SUTURE) ×1 IMPLANT
SUT SILK 2 0 SH CR/8 (SUTURE) ×1 IMPLANT
SUT SILK 3 0 SH 30 (SUTURE) IMPLANT
SUT SILK 4 0 REEL (SUTURE) ×1 IMPLANT
SUT VIC AB 3-0 SH 18 (SUTURE) IMPLANT
TOWEL GREEN STERILE FF (TOWEL DISPOSABLE) ×1 IMPLANT
TRAY ENT MC OR (CUSTOM PROCEDURE TRAY) ×1 IMPLANT

## 2023-07-07 NOTE — Progress Notes (Signed)
Received patient awake and alert x4, s/p left parotidectomy. VSS, 2 L [place] in use post op. Skin assessed, left neck JP drain charged. Left neck incision approximated with skin glue. Orders reviewed, plan of care discussed. Patient ambulated to bathroom to void, steady gait. Safety measures implemented. Handoff report given to Porsche LPN to assume care of patient at this time.

## 2023-07-07 NOTE — Progress Notes (Signed)
Postop check, doing well overall.  Incision looks excellent.  Drain is holding.  Facial nerve is functioning but with slight weakness.  Stable postop.  He would like to spend the night.  Will admit overnight for observation.

## 2023-07-07 NOTE — Discharge Instructions (Signed)
Empty and recharge drain 3 times daily. Keep surgical site clean and dry. Walk around is much as you can.  Avoid strenuous activity for 2 weeks.

## 2023-07-07 NOTE — Anesthesia Preprocedure Evaluation (Signed)
Anesthesia Evaluation  Patient identified by MRN, date of birth, ID band Patient awake    Reviewed: Allergy & Precautions, H&P , NPO status , Patient's Chart, lab work & pertinent test results  Airway Mallampati: III  TM Distance: <3 FB Neck ROM: Full    Dental no notable dental hx. (+) Dental Advisory Given, Poor Dentition, Chipped, Missing   Pulmonary neg pulmonary ROS, pneumonia, COPD, former smoker   Pulmonary exam normal breath sounds clear to auscultation       Cardiovascular Exercise Tolerance: Good hypertension, Pt. on medications and Pt. on home beta blockers negative cardio ROS Normal cardiovascular exam Rhythm:Regular Rate:Normal     Neuro/Psych negative neurological ROS  negative psych ROS   GI/Hepatic negative GI ROS, Neg liver ROS,GERD  ,,  Endo/Other  negative endocrine ROS    Renal/GU negative Renal ROS  negative genitourinary   Musculoskeletal negative musculoskeletal ROS (+) Arthritis ,    Abdominal   Peds negative pediatric ROS (+)  Hematology negative hematology ROS (+)   Anesthesia Other Findings   Reproductive/Obstetrics negative OB ROS                             Anesthesia Physical Anesthesia Plan  ASA: 3  Anesthesia Plan: General   Post-op Pain Management: Minimal or no pain anticipated   Induction: Intravenous  PONV Risk Score and Plan: 2 and Ondansetron, Dexamethasone and Treatment may vary due to age or medical condition  Airway Management Planned: Oral ETT and Video Laryngoscope Planned  Additional Equipment: None  Intra-op Plan:   Post-operative Plan: Extubation in OR  Informed Consent: I have reviewed the patients History and Physical, chart, labs and discussed the procedure including the risks, benefits and alternatives for the proposed anesthesia with the patient or authorized representative who has indicated his/her understanding and  acceptance.       Plan Discussed with: Anesthesiologist and CRNA  Anesthesia Plan Comments: ( Endotracheal tube insertion site: oral Blade size: #4 (used 4 glide) ETT size: 7.5 mm Cuffed: yes View (Cormack Lehane grade): grade I - visualization of entire laryngeal aperture Placement verified by: chest auscultation/breath sounds equal bilaterally and capnometry/+EtCO2  Cuff volume (mL): 7 Measured from: gums ETT to gums (cm): 23  Number of attempts at approach: 1 Airway placement trauma: none Additional Comments Elective glide due to high arched narrow palate and high BMI, easy exposure of glottis grade 1 but moderate difficulty bringing ett into view and placing through glottis, exaggerated bend on ett helped, noted that pt is anterior, no soft tissue damage and no blood noted in airway   )        Anesthesia Quick Evaluation

## 2023-07-07 NOTE — Interval H&P Note (Signed)
History and Physical Interval Note:  07/07/2023 11:31 AM  Glenn Lane  has presented today for surgery, with the diagnosis of Parotid mass.  The various methods of treatment have been discussed with the patient and family. After consideration of risks, benefits and other options for treatment, the patient has consented to  Procedure(s): SUPERFICIAL PAROTIDECTOMY WITH FACIAL NERVE DISSECTION AND MONITORING (Left) as a surgical intervention.  The patient's history has been reviewed, patient examined, no change in status, stable for surgery.  I have reviewed the patient's chart and labs.  Questions were answered to the patient's satisfaction.     Serena Colonel

## 2023-07-07 NOTE — Op Note (Signed)
OPERATIVE REPORT  DATE OF SURGERY: 07/07/2023  PATIENT:  Glenn Lane,  67 y.o. male  PRE-OPERATIVE DIAGNOSIS:  Parotid mass, left  POST-OPERATIVE DIAGNOSIS:  Parotid mass, left  PROCEDURE:  Procedure(s): SUPERFICIAL PAROTIDECTOMY WITH FACIAL NERVE DISSECTION AND MONITORING, Left  SURGEON:  Susy Frizzle, MD  ASSISTANTS: RNFA  ANESTHESIA:   General   EBL:  50 ml  DRAINS: 7 French round JP  LOCAL MEDICATIONS USED:  None  SPECIMEN:  left Parotid  COUNTS:  Correct  PROCEDURE DETAILS: The patient was taken to the operating room and placed on the operating table in the supine position. Following induction of general endotracheal anesthesia, the left side of the face was prepped and draped in a standard fashion. A preauricular incision was outlined with a marking pen with extension down around the mastoid process and 2 fingerbreadths below the angle of the mandible.  Electrocautery was used to incise the skin and subcutaneous tissue. The lateral branch of the greater auricular nerve was preserved. The parotid gland was dissected forward off of the upper sternocleidomastoid muscle and the digastric muscle was exposed posteriorly. The gland was also dissected off of the external auditory canal. Careful dissection superior to the digastric muscle and medial to the tympanomastoid suture line revealed the main trunk of the facial nerve. Using a McCabe dissector the main trunk was dissected out towards the pes anserinus. The upper and lower divisions were then dissected identifying all branches.  The harmonic scalpel was used to divide the parotid tissue. Bipolar cautery was used as needed for completion of hemostasis. The tumor was identified in the middle part of the gland. There were actually 2 masses and 2 lymph nodes. The glandular tissue with the accompanying tumors was dissected free and removed, sent for pathologic evaluation. The wound was irrigated with saline and hemostasis was  completed as needed.  The drain was exited through a separate stab incision and secured in place with silk suture. A running subcuticular monocryl closure was accomplished and Dermabond was used on the skin. The drain was charged. She was awakened, extubated and transferred to recovery in stable condition.    PATIENT DISPOSITION:  To PACU, stable

## 2023-07-07 NOTE — Anesthesia Procedure Notes (Addendum)
Procedure Name: Intubation Date/Time: 07/07/2023 12:20 PM  Performed by: Bethena Midget, MDPre-anesthesia Checklist: Patient identified, Emergency Drugs available, Suction available and Patient being monitored Patient Re-evaluated:Patient Re-evaluated prior to induction Oxygen Delivery Method: Circle system utilized Preoxygenation: Pre-oxygenation with 100% oxygen Induction Type: IV induction Ventilation: Mask ventilation with difficulty, Oral airway inserted - appropriate to patient size and Two handed mask ventilation required Laryngoscope Size: Glidescope and 4 Grade View: Grade I Tube type: Oral Tube size: 8.0 mm Number of attempts: 1 Airway Equipment and Method: Oral airway, Rigid stylet and Video-laryngoscopy Placement Confirmation: ETT inserted through vocal cords under direct vision, positive ETCO2 and breath sounds checked- equal and bilateral Secured at: 25 cm Tube secured with: Tape Dental Injury: Teeth and Oropharynx as per pre-operative assessment  Comments: NIMS tube

## 2023-07-07 NOTE — Transfer of Care (Signed)
Immediate Anesthesia Transfer of Care Note  Patient: Glenn Lane  Procedure(s) Performed: SUPERFICIAL PAROTIDECTOMY WITH FACIAL NERVE DISSECTION AND MONITORING (Left: Neck)  Patient Location: PACU  Anesthesia Type:General  Level of Consciousness: drowsy and patient cooperative  Airway & Oxygen Therapy: Patient Spontanous Breathing and Patient connected to face mask oxygen  Post-op Assessment: Report given to RN and Post -op Vital signs reviewed and stable  Post vital signs: Reviewed and stable  Last Vitals:  Vitals Value Taken Time  BP 161/95 07/07/23 1400  Temp    Pulse 82 07/07/23 1401  Resp 14 07/07/23 1401  SpO2 93 % 07/07/23 1401  Vitals shown include unvalidated device data.  Last Pain:  Vitals:   07/07/23 1021  TempSrc:   PainSc: 0-No pain      Patients Stated Pain Goal: 0 (07/07/23 1021)  Complications: No notable events documented.

## 2023-07-08 ENCOUNTER — Encounter (HOSPITAL_COMMUNITY): Payer: Self-pay | Admitting: Otolaryngology

## 2023-07-08 DIAGNOSIS — R221 Localized swelling, mass and lump, neck: Secondary | ICD-10-CM | POA: Diagnosis not present

## 2023-07-08 LAB — SURGICAL PATHOLOGY

## 2023-07-08 NOTE — Discharge Summary (Signed)
Physician Discharge Summary  Patient ID: Glenn Lane MRN: [id number] DOB/AGE: 05-16-1956 67 y.o.  Admit date: 07/07/2023 Discharge date: 07/08/2023  Admission Diagnoses: Parotid mass  Discharge Diagnoses:  Principal Problem:   Parotid mass   Discharged Condition: Good  Hospital Course: No complications  Consults: none  Significant Diagnostic Studies: none  Treatments: surgery: Left parotidectomy with facial nerve dissection  Discharge Exam: Blood pressure 123/83, pulse 90, temperature 98 F (36.7 C), temperature source Oral, resp. rate 17, height 6\' 5"  (1.956 m), weight (!) 149.7 kg, SpO2 91%. PHYSICAL EXAM: Awake and alert.  Facial nerve is functioning almost normally.  Incision looks good.  Drain is functioning.  Disposition:    Allergies as of 07/08/2023       Reactions   Avelox [moxifloxacin Hcl In Nacl] Shortness Of Breath   Lisinopril Shortness Of Breath, Swelling   Alprazolam Other (See Comments)   Panic attacks        Medication List     TAKE these medications    albuterol 108 (90 Base) MCG/ACT inhaler Commonly known as: VENTOLIN HFA Inhale 1-2 puffs into the lungs every 6 (six) hours as needed for wheezing or shortness of breath.   amLODipine 5 MG tablet Commonly known as: NORVASC Take 5 mg by mouth daily at 6 PM.   atorvastatin 80 MG tablet Commonly known as: LIPITOR Take 80 mg by mouth daily in the afternoon.   celecoxib 100 MG capsule Commonly known as: CELEBREX Take 100 mg by mouth 2 (two) times daily as needed (pain.).   CoQ10 200 MG Caps Take 200 mg by mouth daily.   cyclobenzaprine 10 MG tablet Commonly known as: FLEXERIL Take 10 mg by mouth 2 (two) times daily as needed for muscle spasms.   diazepam 5 MG tablet Commonly known as: VALIUM Take 5 mg by mouth 2 (two) times daily as needed for anxiety.   Fish Oil 1200 MG Caps Take 1,200 mg by mouth daily.   HYDROcodone-acetaminophen 7.5-325 MG tablet Commonly known as:  Norco Take 1 tablet by mouth every 6 (six) hours as needed for moderate pain.   loratadine 10 MG tablet Commonly known as: CLARITIN Take 10 mg by mouth daily at 6 PM.   metoprolol succinate 50 MG 24 hr tablet Commonly known as: TOPROL-XL Take 50 mg by mouth daily at 6 PM.   multivitamin with minerals Tabs tablet Take 1 tablet by mouth in the morning.   omeprazole 20 MG tablet Commonly known as: PRILOSEC OTC Take 20 mg by mouth daily as needed (acid reflux).   ondansetron 4 MG disintegrating tablet Commonly known as: ZOFRAN-ODT Take 1 tablet (4 mg total) by mouth every 8 (eight) hours as needed for nausea or vomiting.   triamcinolone cream 0.5 % Commonly known as: KENALOG Apply 1 Application topically 2 (two) times daily as needed (rash).   VISINE OP Place 1 drop into both eyes daily.   Vitamin D 50 MCG (2000 UT) tablet Take 2,000 Units by mouth daily.        Follow-up Information     Serena Colonel, MD Follow up on 07/12/2023.   Specialty: Otolaryngology Why: Come to the office at 1:00 pm for drain removal. Contact information: [address] [place] [address] [phone]                 Signed: Serena Colonel 07/08/2023, 8:56 AM

## 2023-07-09 NOTE — Anesthesia Postprocedure Evaluation (Signed)
Anesthesia Post Note  Patient: Glenn Lane  Procedure(s) Performed: SUPERFICIAL PAROTIDECTOMY WITH FACIAL NERVE DISSECTION AND MONITORING (Left: Neck)     Patient location during evaluation: PACU Anesthesia Type: General Level of consciousness: awake and alert Pain management: pain level controlled Vital Signs Assessment: post-procedure vital signs reviewed and stable Respiratory status: spontaneous breathing, nonlabored ventilation, respiratory function stable and patient connected to nasal cannula oxygen Cardiovascular status: blood pressure returned to baseline and stable Postop Assessment: no apparent nausea or vomiting Anesthetic complications: no   No notable events documented.  Last Vitals:  Vitals:   07/08/23 0525 07/08/23 0741  BP: 139/89 123/83  Pulse: 80 90  Resp: 16 17  Temp: 36.5 C 36.7 C  SpO2: 93% 91%    Last Pain:  Vitals:   07/08/23 0800  TempSrc:   PainSc: 0-No pain                 [name]

## 2025-01-20 ENCOUNTER — Emergency Department (HOSPITAL_COMMUNITY)
Admission: EM | Admit: 2025-01-20 | Discharge: 2025-01-20 | Disposition: A | Discharge status: Home / Self Care | Attending: Student in an Organized Health Care Education/Training Program | Admitting: Student in an Organized Health Care Education/Training Program

## 2025-01-20 ENCOUNTER — Emergency Department (HOSPITAL_COMMUNITY)

## 2025-01-20 ENCOUNTER — Encounter (HOSPITAL_COMMUNITY): Payer: Self-pay | Admitting: Emergency Medicine

## 2025-01-20 DIAGNOSIS — T520X1A Toxic effect of petroleum products, accidental (unintentional), initial encounter: Secondary | ICD-10-CM | POA: Insufficient documentation

## 2025-01-20 DIAGNOSIS — Z77098 Contact with and (suspected) exposure to other hazardous, chiefly nonmedicinal, chemicals: Secondary | ICD-10-CM

## 2025-01-20 NOTE — Discharge Instructions (Signed)
 You were evaluated in the emergency department today for lung evaluation  Based on your evaluation, there is no evidence of a serious or life-threatening condition at this time.  However, we recommend that you continue closely monitoring your symptoms and arrange close follow-up with your primary care provider for reevaluation.  Your symptoms may improve on their own, but we recommend follow-up or return to the emergency department if they continue/worsen.  Follow-up: - Follow-up with your primary care provider or a specialist if your symptoms persist, worsen, or you have additional concerns.   - We typically recommend follow-up with your PCP within the next 1-3 days, unless directed otherwise by your medical provider. - If a follow-up appointment has already been scheduled, we recommend you continue to keep that appointment to discuss your recent ED visit  Return to the emergency department if you experience: - Worsening or new symptoms - New fever or fever that persists - Difficulty breathing, chest pain, severe pain, or weakness. - Signs of infection at any wound site (increased redness, warmth, discharge) - You have any other concerns or unexpected changes  Medications: - New medications prescribed by the ED: none - Pain relief: Use over-the-counter pain relievers like ibuprofen  or Tylenol  as directed on the package.  Talk to your medical provider if you have certain medical conditions like kidney disease or liver disease to make sure these medications are safe for you to use. - Continue to take all your medications as directed.  Contact your primary care physician (or seek medical care from a medical provider) if you have any side effects or questions  Other instructions: - Drink plenty of fluids to stay hydrated (we recommend water or fluids that contain electrolytes including Gatorade/Powerade/Pedialyte) - Continue to rest and care for yourself at home - Avoid strenuous activity if  feeling unwell - If applicable: consider applying ice packs or a heating pad to the affected area of injury for 20 minutes at a time, 4-5 times a day, for the next 24-48 hours.   Once again, we highly encourage you to contact your primary care provider or return to the emergency department if you have any further concerns or questions.
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
## 2025-01-20 NOTE — ED Provider Notes (Signed)
 " [place] [facility] Provider Note   CSN: [id number] Arrival date & time: 01/20/25  [id number]     Patient presents with: No chief complaint on file.   Glenn Lane is a 69 y.o. male.  {Add pertinent medical, surgical, social history, OB history to HPI:32947} HPI     Prior to Admission medications  Medication Sig Start Date End Date Taking? Authorizing Provider  albuterol  (VENTOLIN  HFA) 108 (90 Base) MCG/ACT inhaler Inhale 1-2 puffs into the lungs every 6 (six) hours as needed for wheezing or shortness of breath.    [provider]  amLODipine  (NORVASC ) 5 MG tablet Take 5 mg by mouth daily at 6 PM. 12/15/21   [provider]  atorvastatin  (LIPITOR) 80 MG tablet Take 80 mg by mouth daily in the afternoon. 12/02/21   [provider]  celecoxib  (CELEBREX ) 100 MG capsule Take 100 mg by mouth 2 (two) times daily as needed (pain.). 09/07/22   [provider]  Cholecalciferol  (VITAMIN D ) 50 MCG (2000 UT) tablet Take 2,000 Units by mouth daily.    [provider]  Coenzyme Q10 (COQ10) 200 MG CAPS Take 200 mg by mouth daily.    [provider]  cyclobenzaprine  (FLEXERIL ) 10 MG tablet Take 10 mg by mouth 2 (two) times daily as needed for muscle spasms. 11/25/21   [provider]  diazepam  (VALIUM ) 5 MG tablet Take 5 mg by mouth 2 (two) times daily as needed for anxiety. 04/14/23   [provider]  HYDROcodone -acetaminophen  (NORCO) 7.5-325 MG tablet Take 1 tablet by mouth every 6 (six) hours as needed for moderate pain. 07/07/23   Jesus Oliphant, MD  loratadine  (CLARITIN ) 10 MG tablet Take 10 mg by mouth daily at 6 PM.    [provider]  metoprolol  succinate (TOPROL -XL) 50 MG 24 hr tablet Take 50 mg by mouth daily at 6 PM. 12/18/22   [provider]  Multiple Vitamin (MULTIVITAMIN WITH MINERALS) TABS tablet Take 1 tablet by mouth in the morning.    [provider]   Naphazoline-Pheniramine (VISINE OP) Place 1 drop into both eyes daily.    [provider]  Omega-3 Fatty Acids (FISH OIL) 1200 MG CAPS Take 1,200 mg by mouth daily.    [provider]  omeprazole (PRILOSEC OTC) 20 MG tablet Take 20 mg by mouth daily as needed (acid reflux).    [provider]  ondansetron  (ZOFRAN -ODT) 4 MG disintegrating tablet Take 1 tablet (4 mg total) by mouth every 8 (eight) hours as needed for nausea or vomiting. 07/07/23   Jesus Oliphant, MD  triamcinolone  cream (KENALOG ) 0.5 % Apply 1 Application topically 2 (two) times daily as needed (rash).    [provider]    Allergies: Avelox  [moxifloxacin  hcl in nacl], Lisinopril , and Alprazolam    Review of Systems  Updated Vital Signs BP (!) 151/94 (BP Location: Right Arm)   Pulse 88   Temp 97.9 F (36.6 C)   Resp 18   SpO2 97%   Physical Exam  (all labs ordered are listed, but only abnormal results are displayed) Labs Reviewed - No data to display  EKG: None  Radiology: No results found.  {Document cardiac monitor, telemetry assessment procedure when appropriate:32947} Procedures   Medications Ordered in the ED - No data to display    {Click here for ABCD2, HEART and other calculators REFRESH Note before signing:1}  Medical Decision Making Amount and/or Complexity of Data Reviewed Radiology: ordered.   ***  {Document critical care time when appropriate  Document review of labs and clinical decision tools ie CHADS2VASC2, etc  Document your independent review of radiology images and any outside records  Document your discussion with family members, caretakers and with consultants  Document social determinants of health affecting pt's care  Document your decision making why or why not admission, treatments were needed:32947:::1}   Final diagnoses:  None    ED Discharge Orders     None        "

## 2025-01-21 ENCOUNTER — Encounter (HOSPITAL_COMMUNITY): Payer: Self-pay

## 2025-01-21 ENCOUNTER — Other Ambulatory Visit: Payer: Self-pay

## 2025-01-21 ENCOUNTER — Emergency Department (HOSPITAL_COMMUNITY)
Admission: EM | Admit: 2025-01-21 | Discharge: 2025-01-21 | Disposition: A | Discharge status: Home / Self Care | Attending: Emergency Medicine | Admitting: Emergency Medicine

## 2025-01-21 DIAGNOSIS — I10 Essential (primary) hypertension: Secondary | ICD-10-CM | POA: Insufficient documentation

## 2025-01-21 DIAGNOSIS — Z79899 Other long term (current) drug therapy: Secondary | ICD-10-CM | POA: Diagnosis not present

## 2025-01-21 DIAGNOSIS — R0602 Shortness of breath: Secondary | ICD-10-CM | POA: Diagnosis not present

## 2025-01-21 DIAGNOSIS — L729 Follicular cyst of the skin and subcutaneous tissue, unspecified: Secondary | ICD-10-CM | POA: Insufficient documentation

## 2025-01-21 LAB — CBC WITH DIFFERENTIAL/PLATELET
Abs Immature Granulocytes: 0.02 10*3/uL (ref 0.00–0.07)
Basophils Absolute: 0 10*3/uL (ref 0.0–0.1)
Basophils Relative: 1 %
Eosinophils Absolute: 0.2 10*3/uL (ref 0.0–0.5)
Eosinophils Relative: 3 %
HCT: 47.5 % (ref 39.0–52.0)
Hemoglobin: 16.4 g/dL (ref 13.0–17.0)
Immature Granulocytes: 0 %
Lymphocytes Relative: 22 %
Lymphs Abs: 1.8 10*3/uL (ref 0.7–4.0)
MCH: 30.6 pg (ref 26.0–34.0)
MCHC: 34.5 g/dL (ref 30.0–36.0)
MCV: 88.6 fL (ref 80.0–100.0)
Monocytes Absolute: 1 10*3/uL (ref 0.1–1.0)
Monocytes Relative: 12 %
Neutro Abs: 4.9 10*3/uL (ref 1.7–7.7)
Neutrophils Relative %: 62 %
Platelets: 186 10*3/uL (ref 150–400)
RBC: 5.36 MIL/uL (ref 4.22–5.81)
RDW: 12.6 % (ref 11.5–15.5)
WBC: 7.9 10*3/uL (ref 4.0–10.5)
nRBC: 0 % (ref 0.0–0.2)

## 2025-01-21 LAB — COMPREHENSIVE METABOLIC PANEL WITH GFR
ALT: 28 U/L (ref 0–44)
AST: 24 U/L (ref 15–41)
Albumin: 4 g/dL (ref 3.5–5.0)
Alkaline Phosphatase: 101 U/L (ref 38–126)
Anion gap: 10 (ref 5–15)
BUN: 12 mg/dL (ref 8–23)
CO2: 24 mmol/L (ref 22–32)
Calcium: 9.3 mg/dL (ref 8.9–10.3)
Chloride: 105 mmol/L (ref 98–111)
Creatinine, Ser: 0.99 mg/dL (ref 0.61–1.24)
GFR, Estimated: >60 mL/min
Glucose, Bld: 126 mg/dL — ABNORMAL HIGH (ref 70–99)
Potassium: 4.1 mmol/L (ref 3.5–5.1)
Sodium: 140 mmol/L (ref 135–145)
Total Bilirubin: 0.9 mg/dL (ref 0.0–1.2)
Total Protein: 6.2 g/dL — ABNORMAL LOW (ref 6.5–8.1)

## 2025-01-21 LAB — RESP PANEL BY RT-PCR (RSV, FLU A&B, COVID)  RVPGX2
Influenza A by PCR: NEGATIVE
Influenza B by PCR: NEGATIVE
Resp Syncytial Virus by PCR: NEGATIVE
SARS Coronavirus 2 by RT PCR: NEGATIVE

## 2025-01-21 MED ORDER — ACETAMINOPHEN 325 MG PO TABS
650.0000 mg | ORAL_TABLET | Freq: Once | ORAL | Status: AC
  Administered 2025-01-21: 650 mg via ORAL
  Filled 2025-01-21: qty 2

## 2025-01-21 NOTE — ED Provider Notes (Signed)
 " [place] [facility] AT [facility] Provider Note   CSN: [id number] Arrival date & time: 01/21/25  1002     Patient presents with: Shortness of Breath   Glenn Lane is a 69 y.o. male.  presents to the ER for cyst to the back and shortness of breath.  Patient notes a firm cyst to the left mid back that occurred a few months ago and has been increasing in size and more painful.  He states that he had a similar cyst in the past which was removed by surgery.  Patient denies redness of the cyst or fevers.  Patient reports breathing a lot of kerosene few days ago and was seen here for further evaluation.  A chest x-ray was obtained yesterday which was negative.  He reports shortness of breath while laying down as well as cough and congestion.  He had headache yesterday which is improving.  Denies sick contacts. Denies fever, chills, chest pain, nausea, vomiting, abdominal pain, diarrhea, weakness, numbness or tingling, dizziness, or any other symptoms at this time.      Shortness of Breath      Prior to Admission medications  Medication Sig Start Date End Date Taking? Authorizing Provider  albuterol  (VENTOLIN  HFA) 108 (90 Base) MCG/ACT inhaler Inhale 1-2 puffs into the lungs every 6 (six) hours as needed for wheezing or shortness of breath.    [provider]  amLODipine  (NORVASC ) 5 MG tablet Take 5 mg by mouth daily at 6 PM. 12/15/21   [provider]  atorvastatin  (LIPITOR) 80 MG tablet Take 80 mg by mouth daily in the afternoon. 12/02/21   [provider]  celecoxib  (CELEBREX ) 100 MG capsule Take 100 mg by mouth 2 (two) times daily as needed (pain.). 09/07/22   [provider]  Cholecalciferol  (VITAMIN D ) 50 MCG (2000 UT) tablet Take 2,000 Units by mouth daily.    [provider]  Coenzyme Q10 (COQ10) 200 MG CAPS Take 200 mg by mouth daily.    [provider]  cyclobenzaprine  (FLEXERIL ) 10 MG tablet Take 10 mg by  mouth 2 (two) times daily as needed for muscle spasms. 11/25/21   [provider]  diazepam  (VALIUM ) 5 MG tablet Take 5 mg by mouth 2 (two) times daily as needed for anxiety. 04/14/23   [provider]  HYDROcodone -acetaminophen  (NORCO) 7.5-325 MG tablet Take 1 tablet by mouth every 6 (six) hours as needed for moderate pain. 07/07/23   Jesus Oliphant, MD  loratadine  (CLARITIN ) 10 MG tablet Take 10 mg by mouth daily at 6 PM.    [provider]  metoprolol  succinate (TOPROL -XL) 50 MG 24 hr tablet Take 50 mg by mouth daily at 6 PM. 12/18/22   [provider]  Multiple Vitamin (MULTIVITAMIN WITH MINERALS) TABS tablet Take 1 tablet by mouth in the morning.    [provider]  Naphazoline-Pheniramine (VISINE OP) Place 1 drop into both eyes daily.    [provider]  Omega-3 Fatty Acids (FISH OIL) 1200 MG CAPS Take 1,200 mg by mouth daily.    [provider]  omeprazole (PRILOSEC OTC) 20 MG tablet Take 20 mg by mouth daily as needed (acid reflux).    [provider]  ondansetron  (ZOFRAN -ODT) 4 MG disintegrating tablet Take 1 tablet (4 mg total) by mouth every 8 (eight) hours as needed for nausea or vomiting. 07/07/23   Jesus Oliphant, MD  triamcinolone  cream (KENALOG ) 0.5 % Apply 1 Application topically 2 (two) times  daily as needed (rash).    [provider]    Allergies: Avelox  [moxifloxacin  hcl in nacl], Lisinopril , and Alprazolam    Review of Systems  Respiratory:  Positive for shortness of breath.   Skin:        Cyst    Updated Vital Signs BP (!) 140/79   Pulse 63   Temp 98.5 F (36.9 C)   Resp 16   Ht 6' 5 (1.956 m)   Wt (!) 149.7 kg   SpO2 98%   BMI 39.14 kg/m   Physical Exam Vitals and nursing note reviewed.  Constitutional:      General: He is not in acute distress.    Appearance: He is well-developed. He is obese. He is not ill-appearing, toxic-appearing or diaphoretic.  HENT:     Head: Normocephalic  and atraumatic.     Nose: Nose normal.     Mouth/Throat:     Lips: Pink.     Mouth: Mucous membranes are moist.     Palate: No mass.     Pharynx: Oropharynx is clear. No pharyngeal swelling, oropharyngeal exudate, posterior oropharyngeal erythema or uvula swelling.     Tonsils: No tonsillar exudate or tonsillar abscesses.  Eyes:     Conjunctiva/sclera: Conjunctivae normal.  Cardiovascular:     Rate and Rhythm: Normal rate and regular rhythm.     Heart sounds: No murmur heard. Pulmonary:     Effort: Pulmonary effort is normal. No respiratory distress.     Breath sounds: Normal breath sounds. No decreased breath sounds, wheezing, rhonchi or rales.  Chest:     Chest wall: No mass or tenderness.  Abdominal:     Palpations: Abdomen is soft.     Tenderness: There is no abdominal tenderness.  Musculoskeletal:        General: No swelling.     Cervical back: Normal range of motion and neck supple.  Skin:    General: Skin is warm and dry.     Capillary Refill: Capillary refill takes less than 2 seconds.     Comments: 2 cm round cyst to the left flank which is firm and mildly tender to palpation.  No signs of erythema or warmth noted.  Sensation intact.  Neurological:     General: No focal deficit present.     Mental Status: He is alert.  Psychiatric:        Mood and Affect: Mood normal.     (all labs ordered are listed, but only abnormal results are displayed) Labs Reviewed  COMPREHENSIVE METABOLIC PANEL WITH GFR - Abnormal; Notable for the following components:      Result Value   Glucose, Bld 126 (*)    Total Protein 6.2 (*)    All other components within normal limits  RESP PANEL BY RT-PCR (RSV, FLU A&B, COVID)  [id number]  CBC WITH DIFFERENTIAL/PLATELET    EKG: EKG Interpretation Date/Time:  Sunday January 21 2025 10:16:31 EST Ventricular Rate:  68 PR Interval:  218 QRS Duration:  88 QT Interval:  390 QTC Calculation: 415 R Axis:   40  Text Interpretation: Sinus rhythm  Borderline prolonged PR interval Confirmed by Darra Chew [phone]) on 01/21/2025 10:28:17 AM  Radiology: DG Chest 1 View Result Date: 01/20/2025 EXAM: 1 VIEW XRAY OF THE CHEST 01/20/2025 06:46:00 AM COMPARISON: Chest radiographs 12/31/2022 and earlier. CLINICAL HISTORY: 69 year old male. Inhaled kerosene fumes several days ago in an enclosed space. Dizziness, headache, sore throat. Former smoker. FINDINGS: LUNGS AND PLEURA: Symmetric increased pulmonary  interstitial markings with basilar predominance, stable from previous exams, chronic and probably smoking related. No pleural effusion. No pneumothorax. HEART AND MEDIASTINUM: Stable tortuosity of the descending thoracic aorta. No acute abnormality of the cardiac silhouette. BONES AND SOFT TISSUES: No acute osseous abnormality. IMPRESSION: 1. No acute cardiopulmonary abnormality. Electronically signed by: Helayne Hurst MD 01/20/2025 07:25 AM EST RP Workstation: [id number]     .Ultrasound ED Soft Tissue  Date/Time: 01/21/2025 12:26 PM  Performed by: Braxton Dubois, PA-C Authorized by: Braxton Dubois, PA-C   Procedure details:    Indications: localization of abscess     Transverse view:  Visualized   Longitudinal view:  Visualized   Images: not archived   Location:    Location: upper back   Comments:     No fluid of the cyst noted     Medications Ordered in the ED  acetaminophen  (TYLENOL ) tablet 650 mg (650 mg Oral Given 01/21/25 1244)                                    Medical Decision Making Amount and/or Complexity of Data Reviewed Labs: ordered.  Risk OTC drugs.   This patient presents to the ED for concern of cyst to back and shortness of breath. this involves an extensive number of treatment options, and is a complaint that carries with it a high risk of complications and morbidity.  The differential diagnosis includes lipoma, sebaceous cyst, viral illness, influenza   Co morbidities / Chronic conditions that complicate the  patient evaluation  Hypertension, history of lipoma   Additional history obtained:  Additional history obtained from EMR External records from outside source obtained and reviewed including chest x-ray from 01/20/2025 which was negative for pneumonia.   Lab Tests:  I Ordered, and personally interpreted labs.  The pertinent results include:  CBC without signs of leukocytosis.  CMP unremarkable.  Negative respiratory panel.      Cardiac Monitoring: / EKG:  The patient was maintained on a cardiac monitor.  I personally viewed and interpreted the cardiac monitored which showed an underlying rhythm of: normal sinus    Dispostion:  Patient is a 69 year old male presenting today for shortness of breath and cyst to the left flank.  Patient was seen here yesterday after inhaling patient have kerosene and was discharged.  Chest x-ray at that time was negative.  No other labs were obtained.  He continues to be short of breath while laying down and reports URI symptoms.  Patient also notes worsening cyst to the left flank which he would like to be removed.  On exam, he is alert and oriented with no apparent distress.  Lungs are clear.  There is a 2.0 cm rounded cyst in the left flank which is firm and mildly tender to palpation.  No signs of infection.  No erythema or warmth noted.  Sensation intact.  Denies fevers.  Vital signs stable.   CBC without signs of leukocytosis.  CMP unremarkable.  Negative respiratory panel.  Since patient had a chest x-ray done yesterday and reports mild symptoms, no need to repeat at this time.  Discussed these findings with the patient.  Ultrasound of the site does not reveal any fluid that can be drained at this time.  Recommended patient to follow-up with general surgery for evaluation of this.  Return precautions provided.  Advised patient to take Tylenol  or ibuprofen  for pain.  Can apply warm  compresses for this.  Hemodynamically stable.  Patient is in agreement with  plan and is stable for discharge.    Case discussed with Dr. Darra, MD, who is in agreement with plan.        Final diagnoses:  Cyst of skin    ED Discharge Orders     None          Braxton Dubois, PA-C 01/21/25 1329    Long, Fonda MATSU, MD 01/26/25 [phone]  "

## 2025-01-21 NOTE — Discharge Instructions (Addendum)
 Your workup here was reassuring today.  Please follow-up with general surgery listed for evaluation of the cyst.  Can take Tylenol  or ibuprofen  for pain as needed.  Apply warm compress to the area for 10 to 15 minutes several times daily.  Return for worsening pain, fevers, redness or swelling of the cyst, shortness of breath, chest pain.

## 2025-01-21 NOTE — ED Triage Notes (Signed)
 Reports breathed in a lot of kerosene yesterday and was seen.  But reports woke up this morning with morning with still having sob and can't sleep but also wants to evaluate cyst on back.
